# Patient Record
Sex: Male | Born: 1950 | Race: White | Hispanic: No | Smoking: Current every day smoker
Health system: Southern US, Community
[De-identification: ages and names within clinical notes are randomized; demographics above are authoritative.]

## PROBLEM LIST (undated history)

## (undated) DIAGNOSIS — E785 Hyperlipidemia, unspecified: Secondary | ICD-10-CM

## (undated) DIAGNOSIS — Z972 Presence of dental prosthetic device (complete) (partial): Secondary | ICD-10-CM

## (undated) DIAGNOSIS — K118 Other diseases of salivary glands: Secondary | ICD-10-CM

## (undated) HISTORY — PX: COLONOSCOPY: SHX174

## (undated) HISTORY — PX: HEMORROIDECTOMY: SUR656

## (undated) HISTORY — PX: ESOPHAGEAL DILATION: SHX303

## (undated) HISTORY — PX: SHOULDER SURGERY: SHX246

---

## 2001-08-05 ENCOUNTER — Emergency Department (HOSPITAL_COMMUNITY): Admission: EM | Admit: 2001-08-05 | Discharge: 2001-08-05 | Payer: Self-pay | Admitting: Emergency Medicine

## 2001-08-05 ENCOUNTER — Encounter: Payer: Self-pay | Admitting: Emergency Medicine

## 2005-01-25 ENCOUNTER — Emergency Department (HOSPITAL_COMMUNITY): Admission: EM | Admit: 2005-01-25 | Discharge: 2005-01-25 | Payer: Self-pay | Admitting: Emergency Medicine

## 2005-02-06 ENCOUNTER — Encounter: Admission: RE | Admit: 2005-02-06 | Discharge: 2005-02-06 | Payer: Self-pay | Admitting: Orthopedic Surgery

## 2005-04-02 ENCOUNTER — Ambulatory Visit (HOSPITAL_COMMUNITY): Admission: RE | Admit: 2005-04-02 | Discharge: 2005-04-02 | Payer: Self-pay | Admitting: Orthopedic Surgery

## 2006-02-17 ENCOUNTER — Ambulatory Visit (HOSPITAL_COMMUNITY): Admission: RE | Admit: 2006-02-17 | Discharge: 2006-02-17 | Payer: Self-pay | Admitting: Internal Medicine
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
## 2007-03-08 ENCOUNTER — Ambulatory Visit: Payer: Self-pay | Admitting: Internal Medicine

## 2007-03-08 ENCOUNTER — Ambulatory Visit (HOSPITAL_COMMUNITY): Admission: RE | Admit: 2007-03-08 | Discharge: 2007-03-08 | Payer: Self-pay | Admitting: Internal Medicine

## 2007-03-31 ENCOUNTER — Emergency Department (HOSPITAL_COMMUNITY): Admission: EM | Admit: 2007-03-31 | Discharge: 2007-03-31 | Payer: Self-pay | Admitting: Emergency Medicine

## 2010-11-26 NOTE — Op Note (Signed)
Dakota Oneill, Dakota Oneill                ACCOUNT NO.:  [id number]   MEDICAL RECORD NO.:  [id number]          PATIENT TYPE:  AMB   LOCATION:  DAY                           FACILITY:  [facility]   PHYSICIAN:  R. Roetta Sessions, M.D. DATE OF BIRTH:  31-Aug-1950   DATE OF PROCEDURE:  03/08/2007  DATE OF DISCHARGE:                               OPERATIVE REPORT   PROCEDURE:  Screening colonoscopy.   ENDOSCOPIST:  Gerrit Friends. Rourk, M.D.   INDICATIONS FOR PROCEDURE:  This 60 year old gentleman sent over at the  courtesy of Dr. Carylon Perches for colorectal cancer screening.  He tells he  that he had a colonoscopy 10 years ago at [facility]  for reasons that he cannot recall.  He has no lower GI tract symptoms  currently.  There is no family history in first degree relatives of  colon cancer.  Colonoscopy is now being done as a screening maneuver.  This approach has been discussed with the patient at length.  The  potential risks, benefits, and alternatives have been reviewed,  questions answered.  He is agreeable.  Please see the documentation in  the medical record.   PROCEDURE NOTE:  O2 saturation, blood pressure, pulse and respirations  were monitored throughout the entire procedure.   CONSCIOUS SEDATION:  Versed 6 mg IV, Demerol 100 mg IV in divided doses.   INSTRUMENT:  Pentax video chip system.   FINDINGS:  Digital rectal exam revealed no abnormalities.   ENDOSCOPIC FINDINGS:  The prep was adequate.   COLON:  The colonic mucosa was surveyed from the rectosigmoid junction  through the left transverse and right colon to the area of the  appendiceal orifice, ileocecal valve, and cecum.  These structures were  well seen and photographed for the record.  From this level the scope  was slowly withdrawn.  All previously mentioned mucosal surfaces were  again seen.  The patient had a few tiny, pancolonic diverticula.  The  remainder of the colonic mucosa appeared normal.   The scope  was pulled down into the rectum.  A thorough examination of  the rectal mucosa on retroflex view of the anal verge demonstrated no  abnormalities aside from some minimal internal hemorrhoids.  The patient  tolerated the procedure well and was reacted in endoscopy.   IMPRESSION:  1. Minimal internal hemorrhoids otherwise normal rectum.  2. A few scattered pancolonic diverticula.  3. The remainder of the colonic mucosa appeared normal.   RECOMMENDATIONS:  1. Diverticulosis literature provided to Mr. Staszewski.  2. Consider repeat screening colonoscopy in 10 years.      Jonathon Bellows, M.D.  Electronically Signed     RMR/MEDQ  D:  03/08/2007  T:  03/08/2007  Job:  [id number]   cc:   Kingsley Callander. Ouida Sills, MD  Fax: [phone]

## 2012-09-15 ENCOUNTER — Other Ambulatory Visit (INDEPENDENT_AMBULATORY_CARE_PROVIDER_SITE_OTHER): Payer: Self-pay | Admitting: Otolaryngology

## 2012-09-15 DIAGNOSIS — R221 Localized swelling, mass and lump, neck: Secondary | ICD-10-CM

## 2012-09-20 ENCOUNTER — Ambulatory Visit (HOSPITAL_COMMUNITY)
Admission: RE | Admit: 2012-09-20 | Discharge: 2012-09-20 | Disposition: A | Discharge status: Home / Self Care | Payer: 59 | Source: Ambulatory Visit | Attending: Otolaryngology | Admitting: Otolaryngology

## 2012-09-20 DIAGNOSIS — R9389 Abnormal findings on diagnostic imaging of other specified body structures: Secondary | ICD-10-CM | POA: Insufficient documentation

## 2012-09-20 DIAGNOSIS — R221 Localized swelling, mass and lump, neck: Secondary | ICD-10-CM

## 2012-09-20 DIAGNOSIS — K119 Disease of salivary gland, unspecified: Secondary | ICD-10-CM | POA: Insufficient documentation

## 2012-09-20 DIAGNOSIS — R220 Localized swelling, mass and lump, head: Secondary | ICD-10-CM | POA: Insufficient documentation

## 2012-09-20 MED ORDER — IOHEXOL 300 MG/ML  SOLN
75.0000 mL | Freq: Once | INTRAMUSCULAR | Status: AC | PRN
  Administered 2012-09-20: 75 mL via INTRAVENOUS

## 2012-10-12 ENCOUNTER — Encounter (HOSPITAL_BASED_OUTPATIENT_CLINIC_OR_DEPARTMENT_OTHER): Payer: Self-pay | Admitting: *Deleted

## 2012-10-12 DIAGNOSIS — K118 Other diseases of salivary glands: Secondary | ICD-10-CM

## 2012-10-12 HISTORY — DX: Other diseases of salivary glands: K11.8

## 2012-10-16 ENCOUNTER — Emergency Department (HOSPITAL_COMMUNITY)
Admission: EM | Admit: 2012-10-16 | Discharge: 2012-10-16 | Disposition: A | Discharge status: Home / Self Care | Payer: 59 | Attending: Emergency Medicine | Admitting: Emergency Medicine

## 2012-10-16 ENCOUNTER — Encounter (HOSPITAL_COMMUNITY): Payer: Self-pay | Admitting: *Deleted

## 2012-10-16 ENCOUNTER — Emergency Department (HOSPITAL_COMMUNITY): Payer: 59

## 2012-10-16 DIAGNOSIS — R21 Rash and other nonspecific skin eruption: Secondary | ICD-10-CM | POA: Insufficient documentation

## 2012-10-16 DIAGNOSIS — W57XXXA Bitten or stung by nonvenomous insect and other nonvenomous arthropods, initial encounter: Secondary | ICD-10-CM

## 2012-10-16 DIAGNOSIS — F17200 Nicotine dependence, unspecified, uncomplicated: Secondary | ICD-10-CM | POA: Insufficient documentation

## 2012-10-16 DIAGNOSIS — R221 Localized swelling, mass and lump, neck: Secondary | ICD-10-CM | POA: Insufficient documentation

## 2012-10-16 DIAGNOSIS — Z98811 Dental restoration status: Secondary | ICD-10-CM | POA: Insufficient documentation

## 2012-10-16 DIAGNOSIS — Z79899 Other long term (current) drug therapy: Secondary | ICD-10-CM | POA: Insufficient documentation

## 2012-10-16 DIAGNOSIS — R1310 Dysphagia, unspecified: Secondary | ICD-10-CM

## 2012-10-16 DIAGNOSIS — L089 Local infection of the skin and subcutaneous tissue, unspecified: Secondary | ICD-10-CM | POA: Insufficient documentation

## 2012-10-16 DIAGNOSIS — Y929 Unspecified place or not applicable: Secondary | ICD-10-CM | POA: Insufficient documentation

## 2012-10-16 DIAGNOSIS — Y9389 Activity, other specified: Secondary | ICD-10-CM | POA: Insufficient documentation

## 2012-10-16 DIAGNOSIS — R220 Localized swelling, mass and lump, head: Secondary | ICD-10-CM | POA: Insufficient documentation

## 2012-10-16 MED ORDER — CEPHALEXIN 500 MG PO CAPS: 500.0000 mg | ORAL_CAPSULE | Freq: Three times a day (TID) | ORAL | Status: DC

## 2012-10-16 MED ORDER — DIPHENHYDRAMINE HCL 50 MG/ML IJ SOLN
25.0000 mg | Freq: Once | INTRAMUSCULAR | Status: AC
  Administered 2012-10-16: 25 mg via INTRAVENOUS
  Filled 2012-10-16: qty 1

## 2012-10-16 MED ORDER — FAMOTIDINE IN NACL 20-0.9 MG/50ML-% IV SOLN
20.0000 mg | Freq: Once | INTRAVENOUS | Status: AC
  Administered 2012-10-16: 20 mg via INTRAVENOUS
  Filled 2012-10-16: qty 50

## 2012-10-16 NOTE — ED Provider Notes (Signed)
This chart was scribed for Carleene Cooper III, MD by Charolett Bumpers, ED Scribe. The patient was seen in room APA18/APA18. Patient's care was started at 08:46. Medical screening examination/treatment/procedure(s) were conducted as a shared visit with non-physician practitioner(s) and myself.  I personally evaluated the patient during the encounter   Dakota Oneill is a 62 y.o. male who presents to the Emergency Department complaining of difficulty swallowing that began this morning. 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 He states that he drank cold water with some relief, but the tightness has returned. He states that he was bit by an insect 2 days ago that remains red and swollen. He states that he applied alcohol and neosporin on the bite afterwards. He states that he has been eating okay, but hasn't eaten anything since last night. He denies any trouble with handling his secretions.   Dr. 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   This is a shared visit with Janne Napoleon, NP  I personally performed the services described in this documentation, which was scribed in my presence. The recorded information has been reviewed and is accurate.  Osvaldo Human, M.D.     Carleene Cooper III, MD 10/16/12 [phone]

## 2012-10-16 NOTE — ED Notes (Signed)
Patient with no complaints at this time. Respirations even and unlabored. Skin warm/dry. Discharge instructions reviewed with patient at this time. Patient given opportunity to voice concerns/ask questions. IV removed per policy and band-aid applied to site. Patient discharged at this time and left Emergency Department with steady gait.  
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
## 2012-10-16 NOTE — ED Provider Notes (Signed)
This chart was scribed for Dakota Cooper III, MD by Charolett Bumpers, ED Scribe. The patient was seen in room APA18/APA18. Patient's care was started at 08:46.  Medical screening examination/treatment/procedure(s) were conducted as a shared visit with non-physician practitioner(s) and myself. I personally evaluated the patient during the encounter  Dakota Oneill is a 63 y.o. male who presents to the Emergency Department complaining of difficulty swallowing that began this morning. 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 He states that he drank cold water with some relief, but the tightness has returned. He states that he was bit by an insect 2 days ago that remains red and swollen. He states that he applied alcohol and neosporin on the bite afterwards. He states that he has been eating okay, but hasn't eaten anything since last night. He denies any trouble with handling his secretions.  Dr. 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   This is a shared visit with Janne Napoleon, NP  I personally performed the services described in this documentation, which was scribed in my presence. The recorded information has been reviewed and is accurate.  Osvaldo Human, M.D.  Dakota Cooper III, MD  10/16/12 1806   Dakota Cooper III, MD 10/16/12 [phone]

## 2012-10-16 NOTE — ED Notes (Signed)
Patient to have surgery on R neck tumors Monday, began having difficulty swallowing this morning.  Also has unknown insect bite on R neck from Thursday, reddened and swollen.

## 2012-10-16 NOTE — ED Notes (Signed)
States he feels like he can swallow a little easier now.

## 2012-10-18 ENCOUNTER — Encounter (HOSPITAL_BASED_OUTPATIENT_CLINIC_OR_DEPARTMENT_OTHER): Payer: Self-pay | Admitting: Anesthesiology

## 2012-10-18 ENCOUNTER — Ambulatory Visit (HOSPITAL_BASED_OUTPATIENT_CLINIC_OR_DEPARTMENT_OTHER)
Admission: RE | Admit: 2012-10-18 | Discharge: 2012-10-19 | Disposition: A | Discharge status: Home / Self Care | Payer: 59 | Source: Ambulatory Visit | Attending: Otolaryngology | Admitting: Otolaryngology

## 2012-10-18 ENCOUNTER — Encounter (HOSPITAL_BASED_OUTPATIENT_CLINIC_OR_DEPARTMENT_OTHER): Payer: Self-pay

## 2012-10-18 ENCOUNTER — Encounter (HOSPITAL_BASED_OUTPATIENT_CLINIC_OR_DEPARTMENT_OTHER)
Admission: RE | Disposition: A | Discharge status: Home / Self Care | Payer: Self-pay | Source: Ambulatory Visit | Attending: Otolaryngology

## 2012-10-18 ENCOUNTER — Ambulatory Visit (HOSPITAL_BASED_OUTPATIENT_CLINIC_OR_DEPARTMENT_OTHER): Payer: 59 | Admitting: Anesthesiology

## 2012-10-18 DIAGNOSIS — D119 Benign neoplasm of major salivary gland, unspecified: Secondary | ICD-10-CM | POA: Insufficient documentation

## 2012-10-18 DIAGNOSIS — Z9049 Acquired absence of other specified parts of digestive tract: Secondary | ICD-10-CM

## 2012-10-18 DIAGNOSIS — F17200 Nicotine dependence, unspecified, uncomplicated: Secondary | ICD-10-CM | POA: Insufficient documentation

## 2012-10-18 HISTORY — PX: PAROTIDECTOMY: SHX2163

## 2012-10-18 HISTORY — DX: Presence of dental prosthetic device (complete) (partial): Z97.2

## 2012-10-18 HISTORY — DX: Other diseases of salivary glands: K11.8

## 2012-10-18 LAB — POCT HEMOGLOBIN-HEMACUE: Hemoglobin: 19.3 g/dL — ABNORMAL HIGH (ref 13.0–17.0)

## 2012-10-18 SURGERY — EXCISION, PAROTID GLAND
Anesthesia: General | Site: Neck | Laterality: Right | Wound class: Clean

## 2012-10-18 MED ORDER — ALPRAZOLAM 1 MG PO TABS
1.0000 mg | ORAL_TABLET | Freq: Every evening | ORAL | Status: DC | PRN
  Administered 2012-10-19: 1 mg via ORAL

## 2012-10-18 MED ORDER — PROPOFOL 10 MG/ML IV BOLUS
INTRAVENOUS | Status: DC | PRN
  Administered 2012-10-18: 30 mg via INTRAVENOUS
  Administered 2012-10-18: 150 mg via INTRAVENOUS

## 2012-10-18 MED ORDER — MIDAZOLAM HCL 2 MG/2ML IJ SOLN: 1.0000 mg | INTRAMUSCULAR | Status: DC | PRN

## 2012-10-18 MED ORDER — FENTANYL CITRATE 0.05 MG/ML IJ SOLN: 50.0000 ug | INTRAMUSCULAR | Status: DC | PRN

## 2012-10-18 MED ORDER — LACTATED RINGERS IV SOLN
INTRAVENOUS | Status: DC
  Administered 2012-10-18 (×3): via INTRAVENOUS

## 2012-10-18 MED ORDER — ONDANSETRON HCL 4 MG/2ML IJ SOLN
INTRAMUSCULAR | Status: DC | PRN
  Administered 2012-10-18: 4 mg via INTRAVENOUS

## 2012-10-18 MED ORDER — MIDAZOLAM HCL 2 MG/ML PO SYRP: 12.0000 mg | ORAL_SOLUTION | Freq: Once | ORAL | Status: DC | PRN

## 2012-10-18 MED ORDER — MORPHINE SULFATE 2 MG/ML IJ SOLN: 2.0000 mg | INTRAMUSCULAR | Status: DC | PRN

## 2012-10-18 MED ORDER — PROMETHAZINE HCL 25 MG RE SUPP: 25.0000 mg | Freq: Four times a day (QID) | RECTAL | Status: DC | PRN

## 2012-10-18 MED ORDER — CEPHALEXIN 500 MG PO CAPS
500.0000 mg | ORAL_CAPSULE | Freq: Three times a day (TID) | ORAL | Status: DC
  Administered 2012-10-18 – 2012-10-19 (×3): 500 mg via ORAL

## 2012-10-18 MED ORDER — DEXAMETHASONE SODIUM PHOSPHATE 4 MG/ML IJ SOLN
INTRAMUSCULAR | Status: DC | PRN
  Administered 2012-10-18: 10 mg via INTRAVENOUS

## 2012-10-18 MED ORDER — CEFAZOLIN SODIUM-DEXTROSE 2-3 GM-% IV SOLR
INTRAVENOUS | Status: DC | PRN
  Administered 2012-10-18: 2 g via INTRAVENOUS

## 2012-10-18 MED ORDER — KCL IN DEXTROSE-NACL 20-5-0.45 MEQ/L-%-% IV SOLN
INTRAVENOUS | Status: DC
  Administered 2012-10-18: 13:00:00 via INTRAVENOUS

## 2012-10-18 MED ORDER — PROMETHAZINE HCL 25 MG PO TABS: 25.0000 mg | ORAL_TABLET | Freq: Four times a day (QID) | ORAL | Status: DC | PRN

## 2012-10-18 MED ORDER — FENTANYL CITRATE 0.05 MG/ML IJ SOLN
INTRAMUSCULAR | Status: DC | PRN
  Administered 2012-10-18 (×2): 25 ug via INTRAVENOUS
  Administered 2012-10-18: 100 ug via INTRAVENOUS
  Administered 2012-10-18: 50 ug via INTRAVENOUS

## 2012-10-18 MED ORDER — OXYCODONE HCL 5 MG/5ML PO SOLN: 5.0000 mg | Freq: Once | ORAL | Status: AC | PRN

## 2012-10-18 MED ORDER — OXYCODONE-ACETAMINOPHEN 5-325 MG PO TABS
1.0000 | ORAL_TABLET | ORAL | Status: DC | PRN
  Administered 2012-10-18 – 2012-10-19 (×5): 2 via ORAL

## 2012-10-18 MED ORDER — HYDROMORPHONE HCL PF 1 MG/ML IJ SOLN: 0.2500 mg | INTRAMUSCULAR | Status: DC | PRN

## 2012-10-18 MED ORDER — LIDOCAINE-EPINEPHRINE 1 %-1:100000 IJ SOLN
INTRAMUSCULAR | Status: DC | PRN
  Administered 2012-10-18: 5 mL

## 2012-10-18 MED ORDER — ZOLPIDEM TARTRATE 5 MG PO TABS: 5.0000 mg | ORAL_TABLET | Freq: Every evening | ORAL | Status: DC | PRN

## 2012-10-18 MED ORDER — GLYCOPYRROLATE 0.2 MG/ML IJ SOLN
INTRAMUSCULAR | Status: DC | PRN
  Administered 2012-10-18: 0.2 mg via INTRAVENOUS

## 2012-10-18 MED ORDER — OXYCODONE HCL 5 MG PO TABS
5.0000 mg | ORAL_TABLET | Freq: Once | ORAL | Status: AC | PRN
Start: 2012-10-18 — End: 2012-10-18

## 2012-10-18 MED ORDER — LIDOCAINE HCL (CARDIAC) 10 MG/ML IV SOLN
INTRAVENOUS | Status: DC | PRN
  Administered 2012-10-18: 80 mg via INTRAVENOUS

## 2012-10-18 MED ORDER — EPHEDRINE SULFATE 50 MG/ML IJ SOLN
INTRAMUSCULAR | Status: DC | PRN
  Administered 2012-10-18: 10 mg via INTRAVENOUS
  Administered 2012-10-18: 5 mg via INTRAVENOUS

## 2012-10-18 MED ORDER — MIDAZOLAM HCL 5 MG/5ML IJ SOLN
INTRAMUSCULAR | Status: DC | PRN
  Administered 2012-10-18: 2 mg via INTRAVENOUS

## 2012-10-18 MED ORDER — SUCCINYLCHOLINE CHLORIDE 20 MG/ML IJ SOLN
INTRAMUSCULAR | Status: DC | PRN
  Administered 2012-10-18: 100 mg via INTRAVENOUS

## 2012-10-18 SURGICAL SUPPLY — 69 items
ADH SKN CLS APL DERMABOND .7 (GAUZE/BANDAGES/DRESSINGS) ×1
APL SRG 3 HI ABS STRL LF PLS (MISCELLANEOUS) ×1
APPLICATOR DR MATTHEWS STRL (MISCELLANEOUS) ×2 IMPLANT
ATTRACTOMAT 16X20 MAGNETIC DRP (DRAPES) ×2 IMPLANT
BAG DECANTER FOR FLEXI CONT (MISCELLANEOUS) IMPLANT
BALL CTTN LRG ABS STRL LF (GAUZE/BANDAGES/DRESSINGS) ×1
BLADE SURG 15 STRL LF DISP TIS (BLADE) ×1 IMPLANT
BLADE SURG 15 STRL SS (BLADE) ×2
CANISTER SUCTION 1200CC (MISCELLANEOUS) ×2 IMPLANT
CLOTH BEACON ORANGE TIMEOUT ST (SAFETY) ×2 IMPLANT
CORDS BIPOLAR (ELECTRODE) ×2 IMPLANT
COTTONBALL LRG STERILE PKG (GAUZE/BANDAGES/DRESSINGS) ×1 IMPLANT
COVER MAYO STAND STRL (DRAPES) ×2 IMPLANT
COVER TABLE BACK 60X90 (DRAPES) ×2 IMPLANT
DECANTER SPIKE VIAL GLASS SM (MISCELLANEOUS) ×1 IMPLANT
DERMABOND ADVANCED (GAUZE/BANDAGES/DRESSINGS) ×1
DERMABOND ADVANCED .7 DNX12 (GAUZE/BANDAGES/DRESSINGS) IMPLANT
DRAIN CHANNEL 10F 3/8 F FF (DRAIN) ×1 IMPLANT
DRAIN CHANNEL 7F FF FLAT (WOUND CARE) IMPLANT
DRAIN TLS ROUND 10FR (DRAIN) IMPLANT
DRAPE INCISE 23X17 IOBAN STRL (DRAPES) ×1
DRAPE INCISE 23X17 STRL (DRAPES) ×1 IMPLANT
DRAPE INCISE IOBAN 23X17 STRL (DRAPES) ×1 IMPLANT
DRAPE U-SHAPE 76X120 STRL (DRAPES) ×2 IMPLANT
ELECT COATED BLADE 2.86 ST (ELECTRODE) ×2 IMPLANT
ELECT PAIRED SUBDERMAL (MISCELLANEOUS) ×2
ELECT REM PT RETURN 9FT ADLT (ELECTROSURGICAL) ×2
ELECTRODE PAIRED SUBDERMAL (MISCELLANEOUS) ×1 IMPLANT
ELECTRODE REM PT RTRN 9FT ADLT (ELECTROSURGICAL) ×1 IMPLANT
EVACUATOR SILICONE 100CC (DRAIN) ×1 IMPLANT
GAUZE SPONGE 4X4 12PLY STRL LF (GAUZE/BANDAGES/DRESSINGS) ×1 IMPLANT
GAUZE SPONGE 4X4 16PLY XRAY LF (GAUZE/BANDAGES/DRESSINGS) ×3 IMPLANT
GLOVE BIO SURGEON STRL SZ 6.5 (GLOVE) ×1 IMPLANT
GLOVE BIO SURGEON STRL SZ7.5 (GLOVE) ×2 IMPLANT
GLOVE ECLIPSE 7.5 STRL STRAW (GLOVE) IMPLANT
GLOVE INDICATOR 7.0 STRL GRN (GLOVE) ×1 IMPLANT
GLOVE SKINSENSE NS SZ7.0 (GLOVE) ×1
GLOVE SKINSENSE STRL SZ7.0 (GLOVE) IMPLANT
GOWN PREVENTION PLUS XLARGE (GOWN DISPOSABLE) ×3 IMPLANT
LOCATOR NERVE 3 VOLT (DISPOSABLE) IMPLANT
NDL HYPO 25X1 1.5 SAFETY (NEEDLE) ×1 IMPLANT
NEEDLE HYPO 25X1 1.5 SAFETY (NEEDLE) ×2 IMPLANT
NS IRRIG 1000ML POUR BTL (IV SOLUTION) ×2 IMPLANT
PACK BASIN DAY SURGERY FS (CUSTOM PROCEDURE TRAY) ×2 IMPLANT
PAD ALCOHOL SWAB (MISCELLANEOUS) ×3 IMPLANT
PENCIL BUTTON HOLSTER BLD 10FT (ELECTRODE) ×2 IMPLANT
PIN SAFETY STERILE (MISCELLANEOUS) IMPLANT
PROBE NERVBE PRASS .33 (MISCELLANEOUS) ×1 IMPLANT
SLEEVE SCD COMPRESS KNEE MED (MISCELLANEOUS) ×1 IMPLANT
SPONGE INTESTINAL PEANUT (DISPOSABLE) ×3 IMPLANT
STAPLER VISISTAT 35W (STAPLE) IMPLANT
SUT CHROMIC 4 0 PS 2 18 (SUTURE) IMPLANT
SUT ETHILON 3 0 PS 1 (SUTURE) ×1 IMPLANT
SUT PROLENE 5 0 P 3 (SUTURE) ×2 IMPLANT
SUT SILK 2 0 FS (SUTURE) ×5 IMPLANT
SUT SILK 2 0 TIES 17X18 (SUTURE)
SUT SILK 2-0 18XBRD TIE BLK (SUTURE) IMPLANT
SUT SILK 3 0 PS 1 (SUTURE) IMPLANT
SUT SILK 3 0 TIES 17X18 (SUTURE) ×4
SUT SILK 3-0 18XBRD TIE BLK (SUTURE) ×2 IMPLANT
SUT SILK 4 0 TIES 17X18 (SUTURE) ×2 IMPLANT
SUT VIC AB 3-0 FS2 27 (SUTURE) ×2 IMPLANT
SUT VICRYL 4-0 PS2 18IN ABS (SUTURE) ×1 IMPLANT
SYR BULB 3OZ (MISCELLANEOUS) ×2 IMPLANT
SYR CONTROL 10ML LL (SYRINGE) ×2 IMPLANT
TOWEL OR 17X24 6PK STRL BLUE (TOWEL DISPOSABLE) ×2 IMPLANT
TRAY DSU PREP LF (CUSTOM PROCEDURE TRAY) ×2 IMPLANT
TRAY FOLEY CATH 14FR (SET/KITS/TRAYS/PACK) IMPLANT
TUBE CONNECTING 20X1/4 (TUBING) ×2 IMPLANT

## 2012-10-18 NOTE — Brief Op Note (Signed)
10/18/2012  11:21 AM  PATIENT:  Dakota Oneill  62 y.o. male  PRE-OPERATIVE DIAGNOSIS:  RIGHT PAROTID MASS  POST-OPERATIVE DIAGNOSIS:  RIGHT PAROTID MASS  PROCEDURE:  Procedure(s): RIGHT LATERAL PAROTIDECTOMY (Right)  SURGEON:  Surgeon(s) and Role:    * Darletta Moll, MD - Primary  PHYSICIAN ASSISTANT:   ASSISTANTS: Roma Schanz, PA-C  ANESTHESIA:   general  EBL:  Total I/O In: 2000 [I.V.:2000] Out: -   BLOOD ADMINISTERED:none  DRAINS: (#10) Jackson-Pratt drain(s) with closed bulb suction in the neck   LOCAL MEDICATIONS USED:  LIDOCAINE   SPECIMEN:  Source of Specimen:  Right lateral parotid lobe  DISPOSITION OF SPECIMEN:  PATHOLOGY  COUNTS:  YES  TOURNIQUET:  * No tourniquets in log *  DICTATION: .Other Dictation: Dictation Number [id number]  PLAN OF CARE: Admit for overnight observation  PATIENT DISPOSITION:  PACU - hemodynamically stable.   Delay start of Pharmacological VTE agent (>24hrs) due to surgical blood loss or risk of bleeding: not applicable

## 2012-10-18 NOTE — Progress Notes (Signed)
Dr Suszanne Conners notified of critical hemocue result

## 2012-10-18 NOTE — Anesthesia Preprocedure Evaluation (Addendum)
Anesthesia Evaluation  Patient identified by MRN, date of birth, ID band Patient awake    Reviewed: Allergy & Precautions, H&P , NPO status , Patient's Chart, lab work & pertinent test results  Airway Mallampati: III TM Distance: >3 FB Neck ROM: Full    Dental no notable dental hx. (+) Teeth Intact and Dental Advisory Given   Pulmonary Current Smoker,  breath sounds clear to auscultation  Pulmonary exam normal       Cardiovascular negative cardio ROS  Rhythm:Regular Rate:Normal     Neuro/Psych negative neurological ROS  negative psych ROS   GI/Hepatic negative GI ROS, Neg liver ROS,   Endo/Other  negative endocrine ROS  Renal/GU negative Renal ROS  negative genitourinary   Musculoskeletal   Abdominal   Peds  Hematology negative hematology ROS (+)   Anesthesia Other Findings   Reproductive/Obstetrics negative OB ROS                          Anesthesia Physical Anesthesia Plan  ASA: II  Anesthesia Plan: General   Post-op Pain Management:    Induction: Intravenous  Airway Management Planned: Oral ETT  Additional Equipment:   Intra-op Plan:   Post-operative Plan: Extubation in OR  Informed Consent: I have reviewed the patients History and Physical, chart, labs and discussed the procedure including the risks, benefits and alternatives for the proposed anesthesia with the patient or authorized representative who has indicated his/her understanding and acceptance.   Dental advisory given  Plan Discussed with: CRNA  Anesthesia Plan Comments:         Anesthesia Quick Evaluation

## 2012-10-18 NOTE — Anesthesia Postprocedure Evaluation (Signed)
  Anesthesia Post-op Note  Patient: Dakota Oneill  Procedure(s) Performed: Procedure(s): RIGHT LATERAL PAROTIDECTOMY (Right)  Patient Location: PACU  Anesthesia Type:General  Level of Consciousness: awake and alert   Airway and Oxygen Therapy: Patient Spontanous Breathing  Post-op Pain: mild  Post-op Assessment: Post-op Vital signs reviewed, Patient's Cardiovascular Status Stable, Respiratory Function Stable, Patent Airway and No signs of Nausea or vomiting  Post-op Vital Signs: Reviewed and stable  Complications: No apparent anesthesia complications

## 2012-10-18 NOTE — Anesthesia Procedure Notes (Signed)
Procedure Name: Intubation Date/Time: 10/18/2012 9:14 AM Performed by: Gar Gibbon Pre-anesthesia Checklist: Patient identified, Emergency Drugs available, Suction available and Patient being monitored Patient Re-evaluated:Patient Re-evaluated prior to inductionOxygen Delivery Method: Circle System Utilized Preoxygenation: Pre-oxygenation with 100% oxygen Intubation Type: IV induction Ventilation: Mask ventilation without difficulty Laryngoscope Size: Miller and 3 Grade View: Grade III Tube type: Oral Rae Tube size: 8.0 mm Number of attempts: 1 Airway Equipment and Method: stylet and oral airway Placement Confirmation: ETT inserted through vocal cords under direct vision,  positive ETCO2 and breath sounds checked- equal and bilateral Secured at: 22 cm Tube secured with: Tape Dental Injury: Teeth and Oropharynx as per pre-operative assessment

## 2012-10-18 NOTE — Transfer of Care (Signed)
Immediate Anesthesia Transfer of Care Note  Patient: Dakota Oneill  Procedure(s) Performed: Procedure(s): RIGHT LATERAL PAROTIDECTOMY (Right)  Patient Location: PACU  Anesthesia Type:General  Level of Consciousness: awake, alert  and oriented  Airway & Oxygen Therapy: Patient Spontanous Breathing and Patient connected to face mask oxygen  Post-op Assessment: Report given to PACU RN and Post -op Vital signs reviewed and stable  Post vital signs: Reviewed and stable  Complications: No apparent anesthesia complications

## 2012-10-18 NOTE — H&P (Signed)
  H&P Update  Pt's original H&P dated 09/27/12 reviewed and placed in chart (to be scanned).  I personally examined the patient today.  No change in health. Proceed with right parotidectomy.

## 2012-10-19 ENCOUNTER — Encounter (HOSPITAL_BASED_OUTPATIENT_CLINIC_OR_DEPARTMENT_OTHER): Payer: Self-pay | Admitting: Otolaryngology

## 2012-10-19 MED ORDER — OXYCODONE-ACETAMINOPHEN 5-325 MG PO TABS: 1.0000 | ORAL_TABLET | ORAL | Status: DC | PRN

## 2012-10-19 NOTE — Discharge Summary (Signed)
Physician Discharge Summary  Patient ID: Dakota Oneill MRN: [id number] DOB/AGE: April 15, 1951 62 y.o.  Admit date: 10/18/2012 Discharge date: 10/19/2012  Admission Diagnoses: Right parotid mass  Discharge Diagnoses: Right parotid mass Active Problems:   * No active hospital problems. *   Discharged Condition: good  Hospital Course: Pt had an uneventful overnight stay. Pt tolerated po well. No bleeding. No facial nerve weakness.  Consults: None  Significant Diagnostic Studies: none  Treatments: surgery: Right parotidectomy  Discharge Exam: Blood pressure 119/76, pulse 75, temperature 97.6 F (36.4 C), temperature source Oral, resp. rate 16, height 6\' 1"  (1.854 m), weight 96.253 kg (212 lb 3.2 oz), SpO2 97.00%. Incision: c/d/i  Disposition: 01-Home or Self Care  Discharge Orders   Future Orders Complete By Expires     Activity as tolerated - No restrictions  As directed     Diet general  As directed         Medication List    TAKE these medications       ALPRAZolam 1 MG tablet  Commonly known as:  XANAX  Take 1 mg by mouth at bedtime as needed for sleep.     cephALEXin 500 MG capsule  Commonly known as:  KEFLEX  Take 1 capsule (500 mg total) by mouth 3 (three) times daily.     fexofenadine-pseudoephedrine 180-240 MG per 24 hr tablet  Commonly known as:  ALLEGRA-D 24  Take 1 tablet by mouth daily.     fish oil-omega-3 fatty acids 1000 MG capsule  Take 2 g by mouth daily.     multivitamin with minerals Tabs  Take 1 tablet by mouth daily.     oxyCODONE-acetaminophen 5-325 MG per tablet  Commonly known as:  ROXICET  Take 1 tablet by mouth every 4 (four) hours as needed for pain.     vitamin B-12 100 MCG tablet  Commonly known as:  CYANOCOBALAMIN  Take 50 mcg by mouth daily.     vitamin C 100 MG tablet  Take 100 mg by mouth daily.     vitamin E 100 UNIT capsule  Take 100 Units by mouth daily.         Signed: Darletta Moll 10/19/2012, 7:23 AM

## 2012-10-20 NOTE — Op Note (Signed)
NAMETIMMY, BUBECK                ACCOUNT NO.:  [id number]  MEDICAL RECORD NO.:  [id number]  LOCATION:                               FACILITY:  [facility]  PHYSICIAN:  Newman Pies, MD            DATE OF BIRTH:  [birth year]  DATE OF PROCEDURE:  10/18/2012 DATE OF DISCHARGE:  10/15/2012                              OPERATIVE REPORT   PREOPERATIVE DIAGNOSIS:  Right parotid mass.  POSTOPERATIVE DIAGNOSIS:  Right parotid mass.  PROCEDURE PERFORMED:  Right lateral parotidectomy with facial nerve dissection.  ANESTHESIA:  General endotracheal tube anesthesia.  COMPLICATIONS:  None.  ESTIMATED BLOOD LOSS:  Minimal.  INDICATION FOR PROCEDURE:  The patient is a 62 year old male with upper right neck mass.  On CT scan, the mass was noted to be within the tail of the right parotid.  A smaller lesion was also noted superior to the originally noted neck mass.  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  Based on the above findings, the decision was made for the patient to undergo the above stated procedure. The risks, benefits, alternatives, and details of the procedure were discussed with the patient.  Questions were invited and answered. Informed consent was obtained.  DESCRIPTION OF PROCEDURE:  The patient was taken to the operating room and placed supine on the operating table.  General endotracheal tube anesthesia was administered by the anesthesiologist.  Preop IV antibiotics was given.  The patient was positioned and prepped and draped in a standard fashion for right lateral parotidectomy surgery. Facial nerve electrodes were placed.  The facial nerve monitoring system was noted to be functional throughout the case.  A 1% lidocaine with 100,000 epinephrine was injected at the planned site of incisions.  A right preauricular incision was made, which was carried down inferiorly to the right lateral neck.  The incision was carried down to the level of the SMAS  layer.  The SMAS flap was then elevated in a standard fashion.  The great auricular nerve was identified and sacrificed.  At this time, the previously noted 2 cm posterior parotid mass was noted. A smaller mass was also noted superior to the 2 cm mass.  Using a combination of sharp dissection and Bovie electrocautery, the soft tissue was resected free from the anterior auricular cartilage.  The dissection was carried down to the level of the facial nerve.  The main trunk of the facial nerve was identified and dissected free from the parotid tissue.  The tumors were noted to be inferior to the inferior branch of the facial nerve.  Dissection was therefore carried out along the inferior branches of the facial nerve, thus freeing the tumors from the facial nerve.  All 5 branches of the facial nerves were identified and preserved.  The entire tumor, together with lateral lobe of the parotid gland was dissected free.  The specimen was sent to the Pathology Department for permanent histologic identification.  The surgical site was copiously irrigated.  A #10 JP drain was placed.  The incision was closed in layers with 3-0 Vicryl and 5-0 Prolene sutures. The care of the  patient was turned over to the anesthesiologist.  The patient was awakened from anesthesia without difficulty.  He was extubated and transferred to the recovery room in good condition.  OPERATIVE FINDINGS:  A 2 cm parotid mass was noted within the tail of the right parotid.  A smaller mass was noted superior to the parotid mass.  The facial nerve was identified and preserved.  SPECIMEN:  Right lateral parotid lobe.  FOLLOWUP CARE:  The patient will be observed overnight at the surgical center.  The JP drain will be removed on postop day #1.  He will most likely be discharged home on postop day #1.     Newman Pies, MD     ST/MEDQ  D:  10/18/2012  T:  10/19/2012  Job:  [id number]  cc:   Kingsley Callander. Ouida Sills, MD

## 2017-01-12 ENCOUNTER — Encounter: Payer: Self-pay | Admitting: Internal Medicine

## 2017-01-12 ENCOUNTER — Telehealth: Payer: Self-pay | Admitting: Internal Medicine

## 2017-01-12 NOTE — Telephone Encounter (Signed)
Letter mailed to pt.  

## 2017-01-12 NOTE — Telephone Encounter (Signed)
Recall for tcs °

## 2017-01-26 ENCOUNTER — Telehealth: Payer: Self-pay

## 2017-01-26 NOTE — Telephone Encounter (Signed)
[phone]   OR CELL [phone]  PATIENT RECEIVED LETTER TO SCHEDULE TCS

## 2017-02-02 ENCOUNTER — Telehealth: Payer: Self-pay

## 2017-02-02 NOTE — Telephone Encounter (Signed)
PT was triaged and will call back with his insurance information.

## 2017-02-02 NOTE — Telephone Encounter (Signed)
See separate triage.  

## 2017-02-12 NOTE — Telephone Encounter (Signed)
Will need OV due to meds and ETOH

## 2017-02-12 NOTE — Telephone Encounter (Signed)
LMOM to call back to schedule OV appt.

## 2017-02-12 NOTE — Telephone Encounter (Signed)
Gastroenterology Pre-Procedure Review  Request Date: 02/02/2017 Requesting Physician: ON RECALL/  DR. FAGAN IS PCP  PATIENT REVIEW QUESTIONS: The patient responded to the following health history questions as indicated:    LAST COLONOSCOPY 03/08/2007 BY DR. Gala Romney  1. Diabetes Melitis: no 2. Joint replacements in the past 12 months: no 3. Major health problems in the past 3 months: no 4. Has an artificial valve or MVP: no 5. Has a defibrillator: no 6. Has been advised in past to take antibiotics in advance of a procedure like teeth cleaning: no 7. Family history of colon cancer: no  8. Alcohol Use: 2-3 DAYS A WEEK WHEN NOT WORKING 9. History of sleep apnea: no  10. History of coronary artery or other vascular stents placed within the last 12 months: no 11. History of any prior anesthesia complications: no    MEDICATIONS & ALLERGIES:    Patient reports the following regarding taking any blood thinners:   Plavix? no Aspirin? no Coumadin? no Brilinta? no Xarelto? no Eliquis? no Pradaxa? no Savaysa? no Effient? no  Patient confirms/reports the following medications:  Current Outpatient Prescriptions  Medication Sig Dispense Refill  . ALPRAZolam (XANAX) 1 MG tablet Take 1 mg by mouth at bedtime as needed for sleep.    . Ascorbic Acid (VITAMIN C) 100 MG tablet Take 100 mg by mouth daily.    . fexofenadine (ALLEGRA) 180 MG tablet Take 180 mg by mouth daily.    . fish oil-omega-3 fatty acids 1000 MG capsule Take 2 g by mouth daily.    . Multiple Vitamin (MULTIVITAMIN WITH MINERALS) TABS Take 1 tablet by mouth daily.    . vitamin B-12 (CYANOCOBALAMIN) 100 MCG tablet Take 50 mcg by mouth daily.    . vitamin E 100 UNIT capsule Take 100 Units by mouth daily.    . fexofenadine-pseudoephedrine (ALLEGRA-D 24) 180-240 MG per 24 hr tablet Take 1 tablet by mouth daily.     No current facility-administered medications for this visit.     Patient confirms/reports the following allergies:   No Known Allergies  No orders of the defined types were placed in this encounter.   AUTHORIZATION INFORMATION Primary Insurance:  ID #:   Group #:  Pre-Cert / Auth required:  Pre-Cert / Auth #:   Secondary Insurance:   ID #:  Group #:  Pre-Cert / Auth required: Pre-Cert / Auth #:   SCHEDULE INFORMATION: Procedure has been scheduled as follows:  Date:                 Time:   Location:   This Gastroenterology Pre-Precedure Review Form is being routed to the following provider(s): R. Garfield Cornea, MD

## 2017-02-16 NOTE — Telephone Encounter (Signed)
Pt has been scheduled an OV with Neil Crouch, PA on 04/06/2017 at 8:00 am.

## 2017-04-06 ENCOUNTER — Other Ambulatory Visit: Payer: Self-pay

## 2017-04-06 ENCOUNTER — Encounter: Payer: Self-pay | Admitting: Gastroenterology

## 2017-04-06 ENCOUNTER — Ambulatory Visit (INDEPENDENT_AMBULATORY_CARE_PROVIDER_SITE_OTHER): Payer: 59 | Admitting: Gastroenterology

## 2017-04-06 DIAGNOSIS — T8852XA Failed moderate sedation during procedure, initial encounter: Secondary | ICD-10-CM | POA: Insufficient documentation

## 2017-04-06 DIAGNOSIS — Z1211 Encounter for screening for malignant neoplasm of colon: Secondary | ICD-10-CM | POA: Insufficient documentation

## 2017-04-06 DIAGNOSIS — T8852XD Failed moderate sedation during procedure, subsequent encounter: Secondary | ICD-10-CM | POA: Diagnosis not present

## 2017-04-06 MED ORDER — PEG 3350-KCL-NA BICARB-NACL 420 G PO SOLR: 4000.0000 mL | ORAL | 0 refills | Status: DC

## 2017-04-06 NOTE — Patient Instructions (Signed)
1. Colonoscopy as scheduled. See separate instructions.  

## 2017-04-06 NOTE — Assessment & Plan Note (Signed)
66 y/o male due for average risk screening colonoscopy. 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 Wants full sedation. Plan for colonoscopy with deep sedation in the OR with Dr. Gala Romney.  I have discussed the risks, alternatives, benefits with regards to but not limited to the risk of reaction to medication, bleeding, infection, perforation and the patient is agreeable to proceed. Written consent to be obtained.

## 2017-04-06 NOTE — Progress Notes (Signed)
Primary Care Physician:  Asencion Noble, MD  Primary Gastroenterologist:  Garfield Cornea, MD   Chief Complaint  Patient presents with  . Colonoscopy    HPI:  Dakota Oneill is a 66 y.o. male here to schedule colonoscopy. His last colonoscopy was in August 2008. He had minimal internal hemorrhoids, few scattered pancolonic diverticula, advised, in 10 years.  Patient doing well. Denies constipation, diarrhea, melena, rectal bleeding. No abdominal pain or GI symptoms. He wants to be fully sedated. 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 Takes Xanax on occasion. Drinks about a 12 pack of beer on the weekend.  Current Outpatient Prescriptions  Medication Sig Dispense Refill  . ALPRAZolam (XANAX) 1 MG tablet Take 1 mg by mouth at bedtime as needed for sleep.    . Ascorbic Acid (VITAMIN C) 100 MG tablet Take 100 mg by mouth daily.    . fexofenadine (ALLEGRA) 180 MG tablet Take 180 mg by mouth daily.    . fish oil-omega-3 fatty acids 1000 MG capsule Take 2 g by mouth daily.    . Multiple Vitamin (MULTIVITAMIN WITH MINERALS) TABS Take 1 tablet by mouth daily.    . vitamin B-12 (CYANOCOBALAMIN) 100 MCG tablet Take 50 mcg by mouth daily.    . vitamin E 100 UNIT capsule Take 100 Units by mouth daily.     No current facility-administered medications for this visit.     Allergies as of 04/06/2017  . (No Known Allergies)    Past Medical History:  Diagnosis Date  . 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 Wears partial dentures    upper and lower    Past Surgical History:  Procedure Laterality Date  . COLONOSCOPY     Dr. Gala Romney: Diverticulosis, internal hemorrhoids, next colonoscopy 10 years  . ESOPHAGEAL DILATION    . PAROTIDECTOMY Right 10/18/2012   Procedure: RIGHT LATERAL PAROTIDECTOMY;  Surgeon: Ascencion Dike, MD;  Location: [place];  Service: ENT;  Laterality: Right;  . SHOULDER SURGERY Right    due to fracture    Family History  Problem Relation Age of Onset  . Colon cancer Neg Hx      Social History   Social History  . Marital status: Divorced    Spouse name: N/A  . Number of children: N/A  . Years of education: N/A   Occupational History  . Not on file.   Social History Main Topics  . Smoking status: Current Every Day Smoker    Packs/day: 1.00    Years: 41.00    Types: Cigarettes  . Smokeless tobacco: Never Used  . Alcohol use Yes     Comment: once per week, 12 beers on the weekend  . Drug use: No  . Sexual activity: Not on file   Other Topics Concern  . Not on file   Social History Narrative  . No narrative on file      ROS:  General: Negative for anorexia, weight loss, fever, chills, fatigue, weakness. Eyes: Negative for vision changes.  ENT: Negative for hoarseness, difficulty swallowing , nasal congestion. CV: Negative for chest pain, angina, palpitations, dyspnea on exertion, peripheral edema.  Respiratory: Negative for dyspnea at rest, dyspnea on exertion, cough, sputum, wheezing.  GI: See history of present illness. GU:  Negative for dysuria, hematuria, urinary incontinence, urinary frequency, nocturnal urination.  MS: Negative for joint pain, low back pain.  Derm: Negative for rash or itching.  Neuro: Negative for weakness, abnormal sensation, seizure, frequent headaches, memory loss, confusion.  Psych: Negative  for anxiety, depression, suicidal ideation, hallucinations.  Endo: Negative for unusual weight change.  Heme: Negative for bruising or bleeding. Allergy: Negative for rash or hives.    Physical Examination:  BP 134/79   Pulse 81   Temp (!) 97.2 F (36.2 C) (Oral)   Ht 6\' 1"  (1.854 m)   Wt 223 lb (101.2 kg)   BMI 29.42 kg/m    General: Well-nourished, well-developed in no acute distress.  Head: Normocephalic, atraumatic.   Eyes: Conjunctiva pink, no icterus. Mouth: Oropharyngeal mucosa moist and pink , no lesions erythema or exudate. Neck: Supple without thyromegaly, masses, or lymphadenopathy.  Lungs: Clear to  auscultation bilaterally.  Heart: Regular rate and rhythm, no murmurs rubs or gallops.  Abdomen: Bowel sounds are normal, nontender, nondistended, no hepatosplenomegaly or masses, no abdominal bruits or    hernia , no rebound or guarding.   Rectal: Not performed Extremities: No lower extremity edema. No clubbing or deformities.  Neuro: Alert and oriented x 4 , grossly normal neurologically.  Skin: Warm and dry, no rash or jaundice.   Psych: Alert and cooperative, normal mood and affect.  Imaging Studies: No results found.

## 2017-04-06 NOTE — Progress Notes (Signed)
CC'D TO PCP °

## 2017-04-20 ENCOUNTER — Telehealth: Payer: Self-pay | Admitting: General Practice

## 2017-04-20 NOTE — Telephone Encounter (Signed)
I called the patient to reschedule his procedure, no answer lmom.

## 2017-04-21 ENCOUNTER — Encounter: Payer: Self-pay | Admitting: General Practice

## 2017-04-21 ENCOUNTER — Telehealth: Payer: Self-pay | Admitting: Internal Medicine

## 2017-04-21 NOTE — Telephone Encounter (Signed)
Noted  

## 2017-04-21 NOTE — Telephone Encounter (Signed)
[phone] patient returned call, please call back, stated that if you do not get an answer he is on the other line and he will call you right back within the minute

## 2017-04-21 NOTE — Telephone Encounter (Signed)
I called the patient back and left a detailed message on his machine in regards to his procedure being rescheduled.  I'm also mailing him out new instructions that reflect this change.

## 2017-04-21 NOTE — Telephone Encounter (Signed)
I tried to call the patient, no answer,lmom 

## 2017-04-27 ENCOUNTER — Other Ambulatory Visit (HOSPITAL_COMMUNITY): Payer: 59

## 2017-04-30 ENCOUNTER — Other Ambulatory Visit (HOSPITAL_COMMUNITY): Payer: 59

## 2017-05-08 NOTE — Patient Instructions (Signed)
Dakota Oneill  05/08/2017     @PREFPERIOPPHARMACY @   Your procedure is scheduled on 05/18/2017.  Report to [name] at 9:30 A.M.  Call this number if you have problems the morning of surgery:  [phone]   Remember:  Do not eat food or drink liquids after midnight.  Take these medicines the morning of surgery with A SIP OF WATER Allegra, Pro-Air inhaler and bring with you   Do not wear jewelry, make-up or nail polish.  Do not wear lotions, powders, or perfumes, or deoderant.  Do not shave 48 hours prior to surgery.  Men may shave face and neck.  Do not bring valuables to the hospital.  [facility] is not responsible for any belongings or valuables.  Contacts, dentures or bridgework may not be worn into surgery.  Leave your suitcase in the car.  After surgery it may be brought to your room.  For patients admitted to the hospital, discharge time will be determined by your treatment team.  Patients discharged the day of surgery will not be allowed to drive home.    Please read over the following fact sheets that you were given. Anesthesia Post-op Instructions     PATIENT INSTRUCTIONS POST-ANESTHESIA  IMMEDIATELY FOLLOWING SURGERY:  Do not drive or operate machinery for the first twenty four hours after surgery.  Do not make any important decisions for twenty four hours after surgery or while taking narcotic pain medications or sedatives.  If you develop intractable nausea and vomiting or a severe headache please notify your doctor immediately.  FOLLOW-UP:  Please make an appointment with your surgeon as instructed. You do not need to follow up with anesthesia unless specifically instructed to do so.  WOUND CARE INSTRUCTIONS (if applicable):  Keep a dry clean dressing on the anesthesia/puncture wound site if there is drainage.  Once the wound has quit draining you may leave it open to air.  Generally you should leave the bandage intact for twenty four hours unless there is  drainage.  If the epidural site drains for more than 36-48 hours please call the anesthesia department.  QUESTIONS?:  Please feel free to call your physician or the hospital operator if you have any questions, and they will be happy to assist you.      Colonoscopy, Adult A colonoscopy is an exam to look at the entire large intestine. During the exam, a lubricated, bendable tube is inserted into the anus and then passed into the rectum, colon, and other parts of the large intestine. A colonoscopy is often done as a part of normal colorectal screening or in response to certain symptoms, such as anemia, persistent diarrhea, abdominal pain, and blood in the stool. The exam can help screen for and diagnose medical problems, including:  Tumors.  Polyps.  Inflammation.  Areas of bleeding.  Tell a health care provider about:  Any allergies you have.  All medicines you are taking, including vitamins, herbs, eye drops, creams, and over-the-counter medicines.  Any problems you or family members have had with anesthetic medicines.  Any blood disorders you have.  Any surgeries you have had.  Any medical conditions you have.  Any problems you have had passing stool. What are the risks? Generally, this is a safe procedure. However, problems may occur, including:  Bleeding.  A tear in the intestine.  A reaction to medicines given during the exam.  Infection (rare).  What happens before the procedure? Eating and drinking restrictions Follow instructions from  your health care provider about eating and drinking, which may include:  A few days before the procedure - follow a low-fiber diet. Avoid nuts, seeds, dried fruit, raw fruits, and vegetables.  1-3 days before the procedure - follow a clear liquid diet. Drink only clear liquids, such as clear broth or bouillon, black coffee or tea, clear juice, clear soft drinks or sports drinks, gelatin dessert, and popsicles. Avoid any liquids  that contain red or purple dye.  On the day of the procedure - do not eat or drink anything during the 2 hours before the procedure, or within the time period that your health care provider recommends.  Bowel prep If you were prescribed an oral bowel prep to clean out your colon:  Take it as told by your health care provider. Starting the day before your procedure, you will need to drink a large amount of medicated liquid. The liquid will cause you to have multiple loose stools until your stool is almost clear or light green.  If your skin or anus gets irritated from diarrhea, you may use these to relieve the irritation: ? Medicated wipes, such as adult wet wipes with aloe and vitamin E. ? A skin soothing-product like petroleum jelly.  If you vomit while drinking the bowel prep, take a break for up to 60 minutes and then begin the bowel prep again. If vomiting continues and you cannot take the bowel prep without vomiting, call your health care provider.  General instructions  Ask your health care provider about changing or stopping your regular medicines. This is especially important if you are taking diabetes medicines or blood thinners.  Plan to have someone take you home from the hospital or clinic. What happens during the procedure?  An IV tube may be inserted into one of your veins.  You will be given medicine to help you relax (sedative).  To reduce your risk of infection: ? Your health care team will wash or sanitize their hands. ? Your anal area will be washed with soap.  You will be asked to lie on your side with your knees bent.  Your health care provider will lubricate a long, thin, flexible tube. The tube will have a camera and a light on the end.  The tube will be inserted into your anus.  The tube will be gently eased through your rectum and colon.  Air will be delivered into your colon to keep it open. You may feel some pressure or cramping.  The camera will be  used to take images during the procedure.  A small tissue sample may be removed from your body to be examined under a microscope (biopsy). If any potential problems are found, the tissue will be sent to a lab for testing.  If small polyps are found, your health care provider may remove them and have them checked for cancer cells.  The tube that was inserted into your anus will be slowly removed. The procedure may vary among health care providers and hospitals. What happens after the procedure?  Your blood pressure, heart rate, breathing rate, and blood oxygen level will be monitored until the medicines you were given have worn off.  Do not drive for 24 hours after the exam.  You may have a small amount of blood in your stool.  You may pass gas and have mild abdominal cramping or bloating due to the air that was used to inflate your colon during the exam.  It is up to  you to get the results of your procedure. Ask your health care provider, or the department performing the procedure, when your results will be ready. This information is not intended to replace advice given to you by your health care provider. Make sure you discuss any questions you have with your health care provider. Document Released: 06/27/2000 Document Revised: 04/30/2016 Document Reviewed: 09/11/2015 Elsevier Interactive Patient Education  2018 [employer].

## 2017-05-11 ENCOUNTER — Encounter (HOSPITAL_COMMUNITY): Payer: Self-pay

## 2017-05-11 ENCOUNTER — Other Ambulatory Visit: Payer: Self-pay

## 2017-05-11 ENCOUNTER — Encounter (HOSPITAL_COMMUNITY)
Admission: RE | Admit: 2017-05-11 | Discharge: 2017-05-11 | Disposition: A | Discharge status: Home / Self Care | Payer: 59 | Source: Ambulatory Visit | Attending: Internal Medicine | Admitting: Internal Medicine

## 2017-05-11 DIAGNOSIS — J449 Chronic obstructive pulmonary disease, unspecified: Secondary | ICD-10-CM | POA: Insufficient documentation

## 2017-05-11 DIAGNOSIS — Z79899 Other long term (current) drug therapy: Secondary | ICD-10-CM | POA: Diagnosis not present

## 2017-05-11 DIAGNOSIS — Z01812 Encounter for preprocedural laboratory examination: Secondary | ICD-10-CM | POA: Insufficient documentation

## 2017-05-11 DIAGNOSIS — F17210 Nicotine dependence, cigarettes, uncomplicated: Secondary | ICD-10-CM | POA: Insufficient documentation

## 2017-05-11 DIAGNOSIS — Z1211 Encounter for screening for malignant neoplasm of colon: Secondary | ICD-10-CM | POA: Diagnosis not present

## 2017-05-11 DIAGNOSIS — Z9283 Personal history of failed moderate sedation: Secondary | ICD-10-CM | POA: Diagnosis not present

## 2017-05-11 DIAGNOSIS — Z972 Presence of dental prosthetic device (complete) (partial): Secondary | ICD-10-CM | POA: Diagnosis not present

## 2017-05-11 DIAGNOSIS — Z01810 Encounter for preprocedural cardiovascular examination: Secondary | ICD-10-CM | POA: Insufficient documentation

## 2017-05-11 DIAGNOSIS — F419 Anxiety disorder, unspecified: Secondary | ICD-10-CM | POA: Diagnosis not present

## 2017-05-11 LAB — BASIC METABOLIC PANEL
Anion gap: 9 (ref 5–15)
BUN: 11 mg/dL (ref 6–20)
CO2: 22 mmol/L (ref 22–32)
CREATININE: 0.81 mg/dL (ref 0.61–1.24)
Calcium: 8.8 mg/dL — ABNORMAL LOW (ref 8.9–10.3)
Chloride: 105 mmol/L (ref 101–111)
GFR calc non Af Amer: >60 mL/min (ref >60)
GLUCOSE: 92 mg/dL (ref 65–99)
Potassium: 4 mmol/L (ref 3.5–5.1)
Sodium: 136 mmol/L (ref 135–145)

## 2017-05-11 LAB — CBC
HEMATOCRIT: 41.9 % (ref 39.0–52.0)
HEMOGLOBIN: 14.3 g/dL (ref 13.0–17.0)
MCH: 32.9 pg (ref 26.0–34.0)
MCHC: 34.1 g/dL (ref 30.0–36.0)
MCV: 96.3 fL (ref 78.0–100.0)
Platelets: 275 10*3/uL (ref 150–400)
RBC: 4.35 MIL/uL (ref 4.22–5.81)
RDW: 12.9 % (ref 11.5–15.5)
WBC: 4.9 10*3/uL (ref 4.0–10.5)

## 2017-05-12 ENCOUNTER — Other Ambulatory Visit (HOSPITAL_COMMUNITY): Payer: 59

## 2017-05-15 ENCOUNTER — Telehealth: Payer: Self-pay | Admitting: *Deleted

## 2017-05-15 NOTE — Telephone Encounter (Signed)
Called [employer] [phone]) and spoke w/ Evelena Peat a PA rep. PA done for TCS service date of 05/18/17. Ref # [id number]. This was approved Case # [id number]. Called pre service center and made aware of approval.

## 2017-05-18 ENCOUNTER — Encounter (HOSPITAL_COMMUNITY): Payer: Self-pay | Admitting: *Deleted

## 2017-05-18 ENCOUNTER — Encounter (HOSPITAL_COMMUNITY)
Admission: RE | Disposition: A | Discharge status: Home / Self Care | Payer: Self-pay | Source: Ambulatory Visit | Attending: Internal Medicine

## 2017-05-18 ENCOUNTER — Ambulatory Visit (HOSPITAL_COMMUNITY)
Admission: RE | Admit: 2017-05-18 | Discharge: 2017-05-18 | Disposition: A | Discharge status: Home / Self Care | Payer: 59 | Source: Ambulatory Visit | Attending: Internal Medicine | Admitting: Internal Medicine

## 2017-05-18 ENCOUNTER — Ambulatory Visit (HOSPITAL_COMMUNITY): Payer: 59 | Admitting: Anesthesiology

## 2017-05-18 DIAGNOSIS — Z1212 Encounter for screening for malignant neoplasm of rectum: Secondary | ICD-10-CM | POA: Diagnosis not present

## 2017-05-18 DIAGNOSIS — K5730 Diverticulosis of large intestine without perforation or abscess without bleeding: Secondary | ICD-10-CM | POA: Diagnosis not present

## 2017-05-18 DIAGNOSIS — J449 Chronic obstructive pulmonary disease, unspecified: Secondary | ICD-10-CM | POA: Diagnosis not present

## 2017-05-18 DIAGNOSIS — F419 Anxiety disorder, unspecified: Secondary | ICD-10-CM | POA: Diagnosis not present

## 2017-05-18 DIAGNOSIS — Z1211 Encounter for screening for malignant neoplasm of colon: Secondary | ICD-10-CM

## 2017-05-18 DIAGNOSIS — K640 First degree hemorrhoids: Secondary | ICD-10-CM | POA: Insufficient documentation

## 2017-05-18 DIAGNOSIS — F17210 Nicotine dependence, cigarettes, uncomplicated: Secondary | ICD-10-CM | POA: Insufficient documentation

## 2017-05-18 DIAGNOSIS — Z79899 Other long term (current) drug therapy: Secondary | ICD-10-CM | POA: Diagnosis not present

## 2017-05-18 DIAGNOSIS — D122 Benign neoplasm of ascending colon: Secondary | ICD-10-CM | POA: Diagnosis not present

## 2017-05-18 HISTORY — PX: COLONOSCOPY WITH PROPOFOL: SHX5780

## 2017-05-18 HISTORY — PX: POLYPECTOMY: SHX5525

## 2017-05-18 SURGERY — COLONOSCOPY WITH PROPOFOL
Anesthesia: Monitor Anesthesia Care

## 2017-05-18 MED ORDER — CHLORHEXIDINE GLUCONATE CLOTH 2 % EX PADS: 6.0000 | MEDICATED_PAD | Freq: Once | CUTANEOUS | Status: DC

## 2017-05-18 MED ORDER — PROPOFOL 10 MG/ML IV BOLUS
INTRAVENOUS | Status: DC | PRN
Start: 2017-05-18 — End: 2017-05-18
  Administered 2017-05-18 (×2): 20 mg via INTRAVENOUS

## 2017-05-18 MED ORDER — HYDROMORPHONE HCL 1 MG/ML IJ SOLN
INTRAMUSCULAR | Status: AC
  Filled 2017-05-18: qty 1

## 2017-05-18 MED ORDER — ONDANSETRON 4 MG PO TBDP
ORAL_TABLET | ORAL | Status: AC
Start: 2017-05-18 — End: 2017-05-18
  Filled 2017-05-18: qty 1

## 2017-05-18 MED ORDER — LIDOCAINE HCL (CARDIAC) 10 MG/ML IV SOLN
INTRAVENOUS | Status: DC | PRN
  Administered 2017-05-18: 50 mg via INTRAVENOUS

## 2017-05-18 MED ORDER — ONDANSETRON 4 MG PO TBDP
4.0000 mg | ORAL_TABLET | Freq: Once | ORAL | Status: AC
  Administered 2017-05-18: 4 mg via ORAL

## 2017-05-18 MED ORDER — LACTATED RINGERS IV SOLN
INTRAVENOUS | Status: DC
  Administered 2017-05-18 (×2): via INTRAVENOUS

## 2017-05-18 MED ORDER — LORAZEPAM 2 MG/ML IJ SOLN
0.5000 mg | Freq: Once | INTRAMUSCULAR | Status: AC
  Administered 2017-05-18: 0.5 mg via INTRAVENOUS

## 2017-05-18 MED ORDER — PROPOFOL 500 MG/50ML IV EMUL
INTRAVENOUS | Status: DC | PRN
  Administered 2017-05-18: 180 ug/kg/min via INTRAVENOUS
  Administered 2017-05-18: 100 ug/kg/min via INTRAVENOUS
  Administered 2017-05-18: 160 ug/kg/min via INTRAVENOUS

## 2017-05-18 MED ORDER — HYDROMORPHONE HCL 1 MG/ML IJ SOLN
0.5000 mg | Freq: Once | INTRAMUSCULAR | Status: AC
  Administered 2017-05-18: 0.5 mg via INTRAVENOUS

## 2017-05-18 MED ORDER — LORAZEPAM 2 MG/ML IJ SOLN
INTRAMUSCULAR | Status: AC
  Filled 2017-05-18: qty 1

## 2017-05-18 NOTE — Anesthesia Procedure Notes (Signed)
Procedure Name: MAC Date/Time: 05/18/2017 11:27 AM Performed by: Vista Deck, CRNA Pre-anesthesia Checklist: Patient identified, Emergency Drugs available, Suction available, Timeout performed and Patient being monitored Patient Re-evaluated:Patient Re-evaluated prior to induction Oxygen Delivery Method: Non-rebreather mask

## 2017-05-18 NOTE — Anesthesia Postprocedure Evaluation (Signed)
Anesthesia Post Note  Patient: Dakota Oneill  Procedure(s) Performed: COLONOSCOPY WITH PROPOFOL (N/A ) POLYPECTOMY  Patient location during evaluation: Short Stay Anesthesia Type: MAC Level of consciousness: awake and alert Pain management: satisfactory to patient Vital Signs Assessment: post-procedure vital signs reviewed and stable Respiratory status: spontaneous breathing Cardiovascular status: stable Postop Assessment: no apparent nausea or vomiting and adequate PO intake     Last Vitals:  Vitals:   05/18/17 1230 05/18/17 1237  BP: (!) 149/90   Pulse: (!) 55 (!) 55  Resp: 14 16  Temp:  36.4 C  SpO2: 98% 99%    Last Pain:  Vitals:   05/18/17 1237  TempSrc: Oral                 Marlenne Ridge

## 2017-05-18 NOTE — Discharge Instructions (Addendum)
Diverticulosis Diverticulosis is a condition that develops when small pouches (diverticula) form in the wall of the large intestine (colon). The colon is where water is absorbed and stool is formed. The pouches form when the inside layer of the colon pushes through weak spots in the outer layers of the colon. You may have a few pouches or many of them. What are the causes? The cause of this condition is not known. What increases the risk? The following factors may make you more likely to develop this condition:  Being older than age 27. Your risk for this condition increases with age. Diverticulosis is rare among people younger than age 36. By age 28, many people have it.  Eating a low-fiber diet.  Having frequent constipation.  Being overweight.  Not getting enough exercise.  Smoking.  Taking over-the-counter pain medicines, like aspirin and ibuprofen.  Having a family history of diverticulosis.  What are the signs or symptoms? In most people, there are no symptoms of this condition. If you do have symptoms, they may include:  Bloating.  Cramps in the abdomen.  Constipation or diarrhea.  Pain in the lower left side of the abdomen.  How is this diagnosed? This condition is most often diagnosed during an exam for other colon problems. Because diverticulosis usually has no symptoms, it often cannot be diagnosed independently. This condition may be diagnosed by:  Using a flexible scope to examine the colon (colonoscopy).  Taking an X-ray of the colon after dye has been put into the colon (barium enema).  Doing a CT scan.  How is this treated? You may not need treatment for this condition if you have never developed an infection related to diverticulosis. If you have had an infection before, treatment may include:  Eating a high-fiber diet. This may include eating more fruits, vegetables, and grains.  Taking a fiber supplement.  Taking a live bacteria supplement  (probiotic).  Taking medicine to relax your colon.  Taking antibiotic medicines.  Follow these instructions at home:  Drink 6-8 glasses of water or more each day to prevent constipation.  Try not to strain when you have a bowel movement.  If you have had an infection before: ? Eat more fiber as directed by your health care provider or your diet and nutrition specialist (dietitian). ? Take a fiber supplement or probiotic, if your health care provider approves.  Take over-the-counter and prescription medicines only as told by your health care provider.  If you were prescribed an antibiotic, take it as told by your health care provider. Do not stop taking the antibiotic even if you start to feel better.  Keep all follow-up visits as told by your health care provider. This is important. Contact a health care provider if:  You have pain in your abdomen.  You have bloating.  You have cramps.  You have not had a bowel movement in 3 days. Get help right away if:  Your pain gets worse.  Your bloating becomes very bad.  You have a fever or chills, and your symptoms suddenly get worse.  You vomit.  You have bowel movements that are bloody or black.  You have bleeding from your rectum. Summary  Diverticulosis is a condition that develops when small pouches (diverticula) form in the wall of the large intestine (colon).  You may have a few pouches or many of them.  This condition is most often diagnosed during an exam for other colon problems.  If you have had an  infection related to diverticulosis, treatment may include increasing the fiber in your diet, taking supplements, or taking medicines. °This information is not intended to replace advice given to you by your health care provider. Make sure you discuss any questions you have with your health care provider. °Document Released: 03/27/2004 Document Revised: 05/19/2016 Document Reviewed: 05/19/2016 °Elsevier Interactive  Patient Education © 2017 [employer] °Colon Polyps °Polyps are tissue growths inside the body. Polyps can grow in many places, including the large intestine (colon). A polyp may be a round bump or a mushroom-shaped growth. You could have one polyp or several. °Most colon polyps are noncancerous (benign). However, some colon polyps can become cancerous over time. °What are the causes? °The exact cause of colon polyps is not known. °What increases the risk? °This condition is more likely to develop in people who: °· Have a family history of colon cancer or colon polyps. °· Are older than 50 or older than 45 if they are African American. °· Have inflammatory bowel disease, such as ulcerative colitis or Crohn disease. °· Are overweight. °· Smoke cigarettes. °· Do not get enough exercise. °· Drink too much alcohol. °· Eat a diet that is: °? High in fat and red meat. °? Low in fiber. °· Had childhood cancer that was treated with abdominal radiation. ° °What are the signs or symptoms? °Most polyps do not cause symptoms. If you have symptoms, they may include: °· Blood coming from your rectum when having a bowel movement. °· Blood in your stool. The stool may look dark red or black. °· A change in bowel habits, such as constipation or diarrhea. ° °How is this diagnosed? °This condition is diagnosed with a colonoscopy. This is a procedure that uses a lighted, flexible scope to look at the inside of your colon. °How is this treated? °Treatment for this condition involves removing any polyps that are found. Those polyps will then be tested for cancer. If cancer is found, your health care provider will talk to you about options for colon cancer treatment. °Follow these instructions at home: °Diet °· Eat plenty of fiber, such as fruits, vegetables, and whole grains. °· Eat foods that are high in calcium and vitamin D, such as milk, cheese, yogurt, eggs, liver, fish, and broccoli. °· Limit foods high in fat, red meats, and  processed meats, such as hot dogs, sausage, bacon, and lunch meats. °· Maintain a healthy weight, or lose weight if recommended by your health care provider. °General instructions °· Do not smoke cigarettes. °· Do not drink alcohol excessively. °· Keep all follow-up visits as told by your health care provider. This is important. This includes keeping regularly scheduled colonoscopies. Talk to your health care provider about when you need a colonoscopy. °· Exercise every day or as told by your health care provider. °Contact a health care provider if: °· You have new or worsening bleeding during a bowel movement. °· You have new or increased blood in your stool. °· You have a change in bowel habits. °· You unexpectedly lose weight. °This information is not intended to replace advice given to you by your health care provider. Make sure you discuss any questions you have with your health care provider. °Document Released: 03/26/2004 Document Revised: 12/06/2015 Document Reviewed: 05/21/2015 °Elsevier Interactive Patient Education © 2018 [employer] ° °Colonoscopy °Discharge Instructions ° °Read the instructions outlined below and refer to this sheet in the next few weeks. These discharge instructions provide you with general information on caring   for yourself after you leave the hospital. Your doctor may also give you specific instructions. While your treatment has been planned according to the most current medical practices available, unavoidable complications occasionally occur. If you have any problems or questions after discharge, call Dr. Rourk at [phone]. °ACTIVITY °· You may resume your regular activity, but move at a slower pace for the next 24 hours.  °· Take frequent rest periods for the next 24 hours.  °· Walking will help get rid of the air and reduce the bloated feeling in your belly (abdomen).  °· No driving for 24 hours (because of the medicine (anesthesia) used during the test).   °· Do not sign any  important legal documents or operate any machinery for 24 hours (because of the anesthesia used during the test).  °NUTRITION °· Drink plenty of fluids.  °· You may resume your normal diet as instructed by your doctor.  °· Begin with a light meal and progress to your normal diet. Heavy or fried foods are harder to digest and may make you feel sick to your stomach (nauseated).  °· Avoid alcoholic beverages for 24 hours or as instructed.  °MEDICATIONS °· You may resume your normal medications unless your doctor tells you otherwise.  °WHAT YOU CAN EXPECT TODAY °· Some feelings of bloating in the abdomen.  °· Passage of more gas than usual.  °· Spotting of blood in your stool or on the toilet paper.  °IF YOU HAD POLYPS REMOVED DURING THE COLONOSCOPY: °· No aspirin products for 7 days or as instructed.  °· No alcohol for 7 days or as instructed.  °· Eat a soft diet for the next 24 hours.  °FINDING OUT THE RESULTS OF YOUR TEST °Not all test results are available during your visit. If your test results are not back during the visit, make an appointment with your caregiver to find out the results. Do not assume everything is normal if you have not heard from your caregiver or the medical facility. It is important for you to follow up on all of your test results.  °SEEK IMMEDIATE MEDICAL ATTENTION IF: °· You have more than a spotting of blood in your stool.  °· Your belly is swollen (abdominal distention).  °· You are nauseated or vomiting.  °· You have a temperature over 101.  °· You have abdominal pain or discomfort that is severe or gets worse throughout the day.  ° °Colon polyp and diverticulosis information provided ° °Further recommendations to follow pending review of pathology report ° °

## 2017-05-18 NOTE — Transfer of Care (Signed)
Immediate Anesthesia Transfer of Care Note  Patient: Alton Bouknight Linzy  Procedure(s) Performed: COLONOSCOPY WITH PROPOFOL (N/A ) POLYPECTOMY  Patient Location: PACU  Anesthesia Type:MAC  Level of Consciousness: awake and alert   Airway & Oxygen Therapy: Patient Spontanous Breathing  Post-op Assessment: Report given to RN and Post -op Vital signs reviewed and stable  Post vital signs: Reviewed and stable  Last Vitals:  Vitals:   05/18/17 0955 05/18/17 1000  BP: (!) 148/87   Resp: 15 (!) 26  Temp:    SpO2:      Last Pain:  Vitals:   05/18/17 0942  TempSrc: Oral      Patients Stated Pain Goal: 7 (15/61/53 7943)  Complications: No apparent anesthesia complications

## 2017-05-18 NOTE — Op Note (Signed)
[facility] Patient Name: Dakota Oneill Procedure Date: 05/18/2017 11:12 AM MRN: [id number] Date of Birth: 1951/06/15 Attending MD: Norvel Richards , MD CSN: [id number] Age: 65 Admit Type: Outpatient Procedure:                Colonoscopy Indications:              Screening for colorectal malignant neoplasm Providers:                Norvel Richards, MD, Lurline Del, RN, Rosina Lowenstein, RN Referring MD:              Medicines:                Propofol per Anesthesia Complications:            No immediate complications. Estimated Blood Loss:     Estimated blood loss was minimal. Procedure:                Pre-Anesthesia Assessment:                           - Prior to the procedure, a History and Physical                            was performed, and patient medications and                            allergies were reviewed. The patient's tolerance of                            previous anesthesia was also reviewed. The risks                            and benefits of the procedure and the sedation                            options and risks were discussed with the patient.                            All questions were answered, and informed consent                            was obtained. Prior Anticoagulants: The patient has                            taken no previous anticoagulant or antiplatelet                            agents. ASA Grade Assessment: II - A patient with                            mild systemic disease. After reviewing the risks  and benefits, the patient was deemed in                            satisfactory condition to undergo the procedure.                           After obtaining informed consent, the colonoscope                            was passed under direct vision. Throughout the                            procedure, the patient's blood pressure, pulse, and                            oxygen  saturations were monitored continuously. The                            EC-[id number] ([id number]) scope was introduced through                            the and advanced to the the cecum, identified by                            appendiceal orifice and ileocecal valve. The                            colonoscopy was performed without difficulty. The                            patient tolerated the procedure well. The quality                            of the bowel preparation was adequate. The                            ileocecal valve, appendiceal orifice, and rectum                            were photographed. The quality of the bowel                            preparation was adequate. Scope In: 11:40:11 AM Scope Out: 12:02:06 PM Scope Withdrawal Time: 0 hours 10 minutes 38 seconds  Total Procedure Duration: 0 hours 21 minutes 55 seconds  Findings:      The perianal and digital rectal examinations were normal.      Two sessile polyps were found in the ascending colon. The polyps were 5       to 6 mm in size.      Scattered small-mouthed diverticula were found in the sigmoid colon and       descending colon.      Non-bleeding internal hemorrhoids were found during retroflexion. The       hemorrhoids were moderate and Grade I (internal hemorrhoids that do not  prolapse). These polyps were removed with a cold snare. Resection and       retrieval were complete. Estimated blood loss was minimal. Impression:               - Two 5 to 6 mm polyps in the ascending colon,                            removed with a cold snare. Resected and retrieved.                           - Diverticulosis in the sigmoid colon and in the                            descending colon.                           - Non-bleeding internal hemorrhoids. Moderate Sedation:      Moderate (conscious) sedation was personally administered by an       anesthesia professional. The following parameters were monitored: oxygen        saturation, heart rate, blood pressure, respiratory rate, EKG, adequacy       of pulmonary ventilation, and response to care. Total physician       intraservice time was 33 minutes. Recommendation:           - Repeat colonoscopy date to be determined after                            pending pathology results are reviewed for                            surveillance based on pathology results.                           - Return to GI office (date not yet determined).                           - Patient has a contact number available for                            emergencies. The signs and symptoms of potential                            delayed complications were discussed with the                            patient. Return to normal activities tomorrow.                            Written discharge instructions were provided to the                            patient.                           - Resume previous diet.                           -  Continue present medications.                           - Repeat colonoscopy date to be determined after                            pending pathology results are reviewed for                            surveillance based on pathology results.                           - Return to GI clinic (date not yet determined). Procedure Code(s):        --- Professional ---                           [phone], Colonoscopy, flexible; with removal of                            tumor(s), polyp(s), or other lesion(s) by snare                            technique Diagnosis Code(s):        --- Professional ---                           Z12.11, Encounter for screening for malignant                            neoplasm of colon                           D12.2, Benign neoplasm of ascending colon                           K64.0, First degree hemorrhoids                           K57.30, Diverticulosis of large intestine without                            perforation or  abscess without bleeding CPT copyright 2016 American Medical Association. All rights reserved. The codes documented in this report are preliminary and upon coder review may  be revised to meet current compliance requirements. [name], MD Norvel Richards, MD 05/18/2017 12:51:54 PM This report has been signed electronically. Number of Addenda: 0

## 2017-05-18 NOTE — H&P (Signed)
@LOGO @   Primary Care Physician:  Asencion Noble, MD Primary Gastroenterologist:  Dr. Gala Romney  Pre-Procedure History & Physical: HPI:  Dakota Oneill is a 66 y.o. male is here for a screening colonoscopy.  No bowel symptoms. No family history of colon cancer. Negative colonoscopy 10 years ago.  Past Medical History:  Diagnosis Date  . 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 Wears partial dentures    upper and lower    Past Surgical History:  Procedure Laterality Date  . COLONOSCOPY     Dr. Gala Romney: Diverticulosis, internal hemorrhoids, next colonoscopy 10 years  . ESOPHAGEAL DILATION    . HEMORROIDECTOMY    . SHOULDER SURGERY Right    due to fracture    Prior to Admission medications   Medication Sig Start Date End Date Taking? Authorizing Provider  ALPRAZolam Duanne Moron) 1 MG tablet Take 1 mg by mouth at bedtime.    Yes [provider]  Apple Cider Vinegar 500 MG TABS Take 500 mg by mouth daily. (0930)   Yes [provider]  Ascorbic Acid (VITAMIN C WITH ROSE HIPS) 1000 MG tablet Take 1,000 mg by mouth daily. (0930)   Yes [provider]  fexofenadine (ALLEGRA) 180 MG tablet Take 180 mg by mouth daily. (0930)   Yes [provider]  Multiple Vitamin (MULTIVITAMIN WITH MINERALS) TABS Take 1 tablet by mouth daily. Centrum Silver for men 50+   Yes [provider]  Omega-3 Fatty Acids (SALMON OIL-1000 PO) Take 1,000 mg by mouth daily. (0930)   Yes [provider]  PROAIR HFA 108 (90 Base) MCG/ACT inhaler Inhale 2 puffs into the lungs every 6 (six) hours as needed. For wheezing/shortness of breath. 02/23/17  Yes [provider]  vitamin B-12 (CYANOCOBALAMIN) 1000 MCG tablet Take 1,000 mcg by mouth daily. (0930)   Yes [provider]  vitamin E 400 UNIT capsule Take 400 Units by mouth daily. (0930)   Yes [provider]  polyethylene glycol-electrolytes (TRILYTE) 420 g solution Take 4,000 mLs by mouth as directed. 04/06/17   Daneil Dolin, MD    Allergies as of 04/06/2017  . (No Known Allergies)    Family History  Problem Relation Age of Onset  . Colon cancer Neg Hx     Social History   Socioeconomic History  . Marital status: Divorced    Spouse name: Not on file  . Number of children: Not on file  . Years of education: Not on file  . Highest education level: Not on file  Social Needs  . Financial resource strain: Not on file  . Food insecurity - worry: Not on file  . Food insecurity - inability: Not on file  . Transportation needs - medical: Not on file  . Transportation needs - non-medical: Not on file  Occupational History  . Not on file  Tobacco Use  . Smoking status: Current Every Day Smoker    Packs/day: 0.50    Years: 41.00    Pack years: 20.50    Types: Cigarettes  . Smokeless tobacco: Never Used  Substance and Sexual Activity  . Alcohol use: Yes    Comment: once per week, 12 beers on the weekend  . Drug use: No  . Sexual activity: Not Currently    Birth control/protection: None  Other Topics Concern  . Not on file  Social History Narrative  . Not on file    Review of Systems: See HPI, otherwise negative ROS  Physical Exam: BP Marland Kitchen)  148/87   Temp 97.7 F (36.5 C) (Oral)   Resp (!) 26   SpO2 96%  General:   Alert,  Well-developed, well-nourished, pleasant and cooperative in NAD Lungs:  Clear throughout to auscultation.   No wheezes, crackles, or rhonchi. No acute distress. Heart:  Regular rate and rhythm; no murmurs, clicks, rubs,  or gallops. Abdomen:  Soft, nontender and nondistended. No masses, hepatosplenomegaly or hernias noted. Normal bowel sounds, without guarding, and without rebound.    Impression/Plan: Dakota Oneill is now here to undergo a screening colonoscopy.  Average risk screening examination. Risks, benefits, limitations, imponderables and alternatives regarding colonoscopy have been reviewed with the patient. Questions have been answered. All parties  agreeable.             Notice:  This dictation was prepared with Dragon dictation along with smaller phrase technology. Any transcriptional errors that result from this process are unintentional and may not be corrected upon review.

## 2017-05-18 NOTE — Anesthesia Preprocedure Evaluation (Addendum)
Anesthesia Evaluation  Patient identified by MRN, date of birth, ID band Patient awake    History of Anesthesia Complications (+) history of anesthetic complications (failed conscious sedation)  Airway Mallampati: I  TM Distance: >3 FB Neck ROM: Full    Dental  (+) Poor Dentition   Pulmonary COPD,  COPD inhaler, Current Smoker,    Pulmonary exam normal breath sounds clear to auscultation       Cardiovascular Exercise Tolerance: Good negative cardio ROS Normal cardiovascular exam Rhythm:Regular Rate:Normal  Normal sinus rhythm Normal ECG  (Oct 2018)   Neuro/Psych Anxiety    GI/Hepatic Neg liver ROS,   Endo/Other  negative endocrine ROS  Renal/GU negative Renal ROSResults for Dakota Oneill, Dakota Oneill (MRN [id number]) as of 05/18/2017 09:37  05/11/2017 10:54 Sodium: 136 Potassium: 4.0 Chloride: 105 CO2: 22 Glucose: 92 BUN: 11 Creatinine: 0.81      Musculoskeletal negative musculoskeletal ROS (+)   Abdominal   Peds  Hematology negative hematology ROS (+)   Anesthesia Other Findings   Reproductive/Obstetrics                             Anesthesia Physical Anesthesia Plan  ASA: II  Anesthesia Plan: MAC   Post-op Pain Management:    Induction:   PONV Risk Score and Plan: 0 and Propofol infusion, Ondansetron and Midazolam  Airway Management Planned:   Additional Equipment:   Intra-op Plan:   Post-operative Plan:   Informed Consent: I have reviewed the patients History and Physical, chart, labs and discussed the procedure including the risks, benefits and alternatives for the proposed anesthesia with the patient or authorized representative who has indicated his/her understanding and acceptance.     Plan Discussed with: CRNA and Surgeon  Anesthesia Plan Comments:        Anesthesia Quick Evaluation

## 2017-05-19 ENCOUNTER — Encounter: Payer: Self-pay | Admitting: Internal Medicine

## 2017-05-20 ENCOUNTER — Encounter (HOSPITAL_COMMUNITY): Payer: Self-pay | Admitting: Internal Medicine

## 2019-11-29 ENCOUNTER — Other Ambulatory Visit: Payer: Self-pay | Admitting: Internal Medicine

## 2019-11-29 ENCOUNTER — Other Ambulatory Visit (HOSPITAL_COMMUNITY): Payer: Self-pay | Admitting: Internal Medicine

## 2019-11-29 DIAGNOSIS — F17200 Nicotine dependence, unspecified, uncomplicated: Secondary | ICD-10-CM

## 2019-11-29 DIAGNOSIS — Z122 Encounter for screening for malignant neoplasm of respiratory organs: Secondary | ICD-10-CM

## 2019-12-19 ENCOUNTER — Ambulatory Visit (HOSPITAL_COMMUNITY): Payer: 59

## 2019-12-19 ENCOUNTER — Encounter (HOSPITAL_COMMUNITY): Payer: Self-pay

## 2021-06-28 ENCOUNTER — Other Ambulatory Visit: Payer: Self-pay

## 2021-06-28 ENCOUNTER — Other Ambulatory Visit (HOSPITAL_COMMUNITY): Payer: Self-pay | Admitting: Internal Medicine

## 2021-06-28 ENCOUNTER — Ambulatory Visit (HOSPITAL_COMMUNITY)
Admission: RE | Admit: 2021-06-28 | Discharge: 2021-06-28 | Disposition: A | Discharge status: Home / Self Care | Payer: 59 | Source: Ambulatory Visit | Attending: Internal Medicine | Admitting: Internal Medicine

## 2021-06-28 DIAGNOSIS — R0781 Pleurodynia: Secondary | ICD-10-CM | POA: Diagnosis present

## 2022-05-14 ENCOUNTER — Encounter: Payer: Self-pay | Admitting: *Deleted

## 2023-04-06 ENCOUNTER — Other Ambulatory Visit (HOSPITAL_COMMUNITY): Payer: Self-pay | Admitting: Internal Medicine

## 2023-04-06 DIAGNOSIS — Z720 Tobacco use: Secondary | ICD-10-CM

## 2023-04-06 DIAGNOSIS — F17219 Nicotine dependence, cigarettes, with unspecified nicotine-induced disorders: Secondary | ICD-10-CM

## 2023-04-27 ENCOUNTER — Ambulatory Visit (HOSPITAL_COMMUNITY)
Admission: RE | Admit: 2023-04-27 | Discharge: 2023-04-27 | Disposition: A | Discharge status: Home / Self Care | Payer: 59 | Source: Ambulatory Visit | Attending: Internal Medicine | Admitting: Internal Medicine

## 2023-04-27 DIAGNOSIS — Z720 Tobacco use: Secondary | ICD-10-CM | POA: Diagnosis present

## 2023-04-27 DIAGNOSIS — F17219 Nicotine dependence, cigarettes, with unspecified nicotine-induced disorders: Secondary | ICD-10-CM | POA: Diagnosis present

## 2023-08-24 DIAGNOSIS — Z79899 Other long term (current) drug therapy: Secondary | ICD-10-CM | POA: Diagnosis not present

## 2023-08-24 DIAGNOSIS — L930 Discoid lupus erythematosus: Secondary | ICD-10-CM | POA: Diagnosis not present

## 2023-08-28 ENCOUNTER — Other Ambulatory Visit (HOSPITAL_COMMUNITY): Payer: Self-pay | Admitting: Internal Medicine

## 2023-08-28 DIAGNOSIS — R911 Solitary pulmonary nodule: Secondary | ICD-10-CM

## 2023-08-31 DIAGNOSIS — Z23 Encounter for immunization: Secondary | ICD-10-CM | POA: Diagnosis not present

## 2023-08-31 DIAGNOSIS — I700 Atherosclerosis of aorta: Secondary | ICD-10-CM | POA: Diagnosis not present

## 2023-08-31 DIAGNOSIS — I2510 Atherosclerotic heart disease of native coronary artery without angina pectoris: Secondary | ICD-10-CM | POA: Diagnosis not present

## 2023-08-31 DIAGNOSIS — R911 Solitary pulmonary nodule: Secondary | ICD-10-CM | POA: Diagnosis not present

## 2023-09-07 ENCOUNTER — Ambulatory Visit (HOSPITAL_COMMUNITY)
Admission: RE | Admit: 2023-09-07 | Discharge: 2023-09-07 | Disposition: A | Discharge status: Home / Self Care | Payer: PPO | Source: Ambulatory Visit | Attending: Internal Medicine | Admitting: Internal Medicine

## 2023-09-07 DIAGNOSIS — F17210 Nicotine dependence, cigarettes, uncomplicated: Secondary | ICD-10-CM | POA: Diagnosis not present

## 2023-09-07 DIAGNOSIS — R911 Solitary pulmonary nodule: Secondary | ICD-10-CM | POA: Diagnosis not present

## 2023-10-19 DIAGNOSIS — E785 Hyperlipidemia, unspecified: Secondary | ICD-10-CM | POA: Diagnosis not present

## 2023-10-19 DIAGNOSIS — Z79899 Other long term (current) drug therapy: Secondary | ICD-10-CM | POA: Diagnosis not present

## 2023-10-26 DIAGNOSIS — E785 Hyperlipidemia, unspecified: Secondary | ICD-10-CM | POA: Diagnosis not present

## 2023-10-26 DIAGNOSIS — M25512 Pain in left shoulder: Secondary | ICD-10-CM | POA: Diagnosis not present

## 2023-11-12 ENCOUNTER — Ambulatory Visit: Admitting: Orthopedic Surgery

## 2023-11-12 ENCOUNTER — Other Ambulatory Visit (INDEPENDENT_AMBULATORY_CARE_PROVIDER_SITE_OTHER): Payer: Self-pay

## 2023-11-12 ENCOUNTER — Encounter: Payer: Self-pay | Admitting: Orthopedic Surgery

## 2023-11-12 VITALS — BP 188/107 | HR 67 | Ht 74.0 in | Wt 202.0 lb

## 2023-11-12 DIAGNOSIS — M25512 Pain in left shoulder: Secondary | ICD-10-CM

## 2023-11-12 DIAGNOSIS — G8929 Other chronic pain: Secondary | ICD-10-CM

## 2023-11-12 DIAGNOSIS — M542 Cervicalgia: Secondary | ICD-10-CM

## 2023-11-12 DIAGNOSIS — M4722 Other spondylosis with radiculopathy, cervical region: Secondary | ICD-10-CM

## 2023-11-12 MED ORDER — GABAPENTIN 100 MG PO CAPS: 100.0000 mg | ORAL_CAPSULE | Freq: Three times a day (TID) | ORAL | 2 refills | Status: DC

## 2023-11-12 MED ORDER — METHYLPREDNISOLONE ACETATE 40 MG/ML IJ SUSP
40.0000 mg | Freq: Once | INTRAMUSCULAR | Status: AC
  Administered 2023-11-12: 40 mg via INTRA_ARTICULAR

## 2023-11-12 NOTE — Progress Notes (Signed)
  Intake history:  BP (!) 188/107   Pulse 67   Ht 6\' 2"  (1.88 m)   Wt 202 lb (91.6 kg)   BMI 25.94 kg/m  Body mass index is 25.94 kg/m.    WHAT ARE WE SEEING YOU FOR TODAY?   left shoulder  How long has this bothered you? (DOI?DOS?WS?)  approximately 3 month(s) ago   Anticoag.  No  Diabetes No  Heart disease no  Hypertension No  SMOKING HX Yes  Kidney disease No  Any ALLERGIES ______________________________________________   Treatment:  Have you taken:  Tylenol  No  Advil No  Had PT No  Had injection No  Other  _________________________

## 2023-11-12 NOTE — Progress Notes (Signed)
  Subjective:     Patient ID: Dakota Oneill, male   DOB: 10/28/1950, 73 y.o.   MRN: [id number]  1 male left shoulder pain x 3 mos but injured his shoulder in 1974. He dis not require surgery at that time.  The patient tried some meloxicam 15 mg as it did not hurt  His pain is actually in the supraspinatus fossa and not around the deltoid or the anterior or posterior joint line  Shoulder Pain  The pain is present in the left shoulder (supraspinatus fossa). Episode onset: 3 mos. There has been a history of trauma. The problem occurs constantly. The problem has been unchanged. The pain is moderate. Associated symptoms include a limited range of motion and tingling. Pertinent negatives include no fever, inability to bear weight, itching, joint locking, joint swelling, numbness or stiffness. He has tried NSAIDS (meloxicam) for the symptoms. The treatment provided no relief. There is no history of rheumatoid arthritis.     Review of Systems  Constitutional:  Negative for fever.  Musculoskeletal:  Negative for stiffness.  Skin:  Negative for itching.  Neurological:  Positive for tingling. Negative for numbness.       Objective:   Physical Exam Musculoskeletal:     Left shoulder: Tenderness present. No swelling, deformity, effusion, laceration, bony tenderness or crepitus. Decreased range of motion. Decreased strength. Normal pulse.       Arms:     Comments: Most of the tenderness in his supraspinatus fossa.  No pain with range of motion of the shoulder forward elevation is about 145 degrees abduction 90 degrees no pain  The weakness is in abduction as well as flexion with 4 out of 5 strength       Assessment:     DG Shoulder Left Result Date: 11/12/2023 Shoulder imaging patient planes of shoulder pain but is really in the supraspinatus fossa The shoulder joint itself looks normal the head looks normal the joint space looks normal and the height looks normal The acromion is a type I My  impression is that the shoulder bony anatomy is normal   DG Cervical Spine 2 or 3 views Result Date: 11/12/2023 C-spine imaging midlevel arthritic changes, abnormal alignment on the lateral view Several areas of disc degeneration with endplate irregularities and anterior osteophytic lipping uncovertebral joint arthrosis Impression cervical spondylosis       Plan:     He could have a neck issue especially with the symptoms of tingling.  His cuff weakness is probably chronic  I will try to give him an injection to see if that relieves the pain he can continue his meloxicam and try some gabapentin .  A course of physical therapy would be helpful as well  If he does not improve hopefully we can localize his symptoms and make the appropriate call in terms of an MRI  I will see him in 6 weeks  Procedure note Procedure injection subacromial space left shoulder Consent was verbal Patient site left shoulder confirmed by verbal means Diagnosis left shoulder pain possible impingement Medication Depo-Medrol  40 mg lidocaine  3 cc Technique the skin was cleaned with alcohol and sprayed with ethyl chloride We did with a 20-gauge needle posterior approach Patient was observed no complications

## 2023-12-15 DIAGNOSIS — Z79899 Other long term (current) drug therapy: Secondary | ICD-10-CM | POA: Diagnosis not present

## 2023-12-15 DIAGNOSIS — L932 Other local lupus erythematosus: Secondary | ICD-10-CM | POA: Diagnosis not present

## 2023-12-24 ENCOUNTER — Ambulatory Visit: Admitting: Orthopedic Surgery

## 2023-12-24 ENCOUNTER — Encounter: Payer: Self-pay | Admitting: Orthopedic Surgery

## 2023-12-24 DIAGNOSIS — M542 Cervicalgia: Secondary | ICD-10-CM

## 2023-12-24 DIAGNOSIS — M4722 Other spondylosis with radiculopathy, cervical region: Secondary | ICD-10-CM | POA: Diagnosis not present

## 2023-12-24 DIAGNOSIS — G8929 Other chronic pain: Secondary | ICD-10-CM | POA: Diagnosis not present

## 2023-12-24 DIAGNOSIS — M25512 Pain in left shoulder: Secondary | ICD-10-CM

## 2023-12-24 NOTE — Progress Notes (Signed)
   There were no vitals taken for this visit.  There is no height or weight on file to calculate BMI.  Chief Complaint  Patient presents with   Shoulder Pain    left    No diagnosis found.  DOI/DOS/ Date: feels better / well after injection states no more pain   Resolved

## 2023-12-24 NOTE — Progress Notes (Signed)
 Follow up   Chief Complaint  Patient presents with   Shoulder Pain    left    Encounter Diagnoses  Name Primary?   Cervical spondylosis with radiculopathy Yes   Chronic left shoulder pain    Neck pain     DOI/DOS/ Date: feels better / well after injection states no more pain   Resolved  73 year old male improving mildly full range of motion pain I assume he may need further treatment

## 2024-02-17 ENCOUNTER — Other Ambulatory Visit: Payer: Self-pay | Admitting: Orthopedic Surgery

## 2024-04-11 DIAGNOSIS — Z79899 Other long term (current) drug therapy: Secondary | ICD-10-CM | POA: Diagnosis not present

## 2024-04-11 DIAGNOSIS — R7301 Impaired fasting glucose: Secondary | ICD-10-CM | POA: Diagnosis not present

## 2024-04-11 DIAGNOSIS — Z125 Encounter for screening for malignant neoplasm of prostate: Secondary | ICD-10-CM | POA: Diagnosis not present

## 2024-04-11 DIAGNOSIS — F419 Anxiety disorder, unspecified: Secondary | ICD-10-CM | POA: Diagnosis not present

## 2024-04-11 DIAGNOSIS — N529 Male erectile dysfunction, unspecified: Secondary | ICD-10-CM | POA: Diagnosis not present

## 2024-04-11 DIAGNOSIS — R030 Elevated blood-pressure reading, without diagnosis of hypertension: Secondary | ICD-10-CM | POA: Diagnosis not present

## 2024-04-13 DIAGNOSIS — Z79899 Other long term (current) drug therapy: Secondary | ICD-10-CM | POA: Diagnosis not present

## 2024-04-13 DIAGNOSIS — L932 Other local lupus erythematosus: Secondary | ICD-10-CM | POA: Diagnosis not present

## 2024-04-13 DIAGNOSIS — D692 Other nonthrombocytopenic purpura: Secondary | ICD-10-CM | POA: Diagnosis not present

## 2024-04-18 DIAGNOSIS — Z23 Encounter for immunization: Secondary | ICD-10-CM | POA: Diagnosis not present

## 2024-04-18 DIAGNOSIS — Z0001 Encounter for general adult medical examination with abnormal findings: Secondary | ICD-10-CM | POA: Diagnosis not present

## 2024-04-18 DIAGNOSIS — F410 Panic disorder [episodic paroxysmal anxiety] without agoraphobia: Secondary | ICD-10-CM | POA: Diagnosis not present

## 2024-04-18 DIAGNOSIS — J438 Other emphysema: Secondary | ICD-10-CM | POA: Diagnosis not present

## 2024-04-18 DIAGNOSIS — I700 Atherosclerosis of aorta: Secondary | ICD-10-CM | POA: Diagnosis not present

## 2024-04-18 DIAGNOSIS — R7301 Impaired fasting glucose: Secondary | ICD-10-CM | POA: Diagnosis not present

## 2024-04-18 DIAGNOSIS — M47812 Spondylosis without myelopathy or radiculopathy, cervical region: Secondary | ICD-10-CM | POA: Diagnosis not present

## 2024-04-18 DIAGNOSIS — J3081 Allergic rhinitis due to animal (cat) (dog) hair and dander: Secondary | ICD-10-CM | POA: Diagnosis not present

## 2024-04-19 ENCOUNTER — Encounter (INDEPENDENT_AMBULATORY_CARE_PROVIDER_SITE_OTHER): Payer: Self-pay | Admitting: *Deleted

## 2024-05-02 ENCOUNTER — Telehealth: Payer: Self-pay

## 2024-05-02 NOTE — Telephone Encounter (Signed)
 Who is your primary care physician: Dr.Roy Fagan  Reasons for the colonoscopy: [employer]  Have you had a colonoscopy before?  HX polyps  Yes 2018 Dr.Rourk  Do you have family history of colon cancer? no  Previous colonoscopy with polyps removed? yes  Do you have a history colorectal cancer?   no  Are you diabetic? If yes, Type 1 or Type 2?    no  Do you have a prosthetic or mechanical heart valve? no  Do you have a pacemaker/defibrillator?   no  Have you had endocarditis/atrial fibrillation? no  Have you had joint replacement within the last 12 months?  no  Do you tend to be constipated or have to use laxatives? no  Do you have any history of drugs or alchohol?  Yes beer 10 a week sometimes  Do you use supplemental oxygen?  no  Have you had a stroke or heart attack within the last 6 months? no  Do you take weight loss medication?  no     Do you take any blood-thinning medications such as: (aspirin, warfarin, Plavix, Aggrenox)  no  If yes we need the name, milligram, dosage and who is prescribing doctor  Current Outpatient Medications on File Prior to Visit  Medication Sig Dispense Refill   ALPRAZolam  (XANAX ) 1 MG tablet Take 1 mg by mouth at bedtime.     Ascorbic Acid (VITAMIN C WITH ROSE HIPS) 1000 MG tablet Take 1,000 mg by mouth daily. (0930)     atorvastatin (LIPITOR) 40 MG tablet SMARTSIG:1 Tablet(s) By Mouth Every Evening     hydroxychloroquine (PLAQUENIL) 200 MG tablet Take 200 mg by mouth 2 (two) times daily.     levocetirizine (XYZAL) 5 MG tablet Take 5 mg by mouth daily.     Multiple Vitamin (MULTIVITAMIN WITH MINERALS) TABS Take 1 tablet by mouth daily. Centrum Silver for men 50+     Omega-3 Fatty Acids (SALMON OIL-1000 PO) Take 1,000 mg by mouth daily. (0930)     triamcinolone cream (KENALOG) 0.1 % Apply 1 Application topically daily.     vitamin B-12 (CYANOCOBALAMIN) 1000 MCG tablet Take 1,000 mcg by mouth daily. (0930)     vitamin E 400 UNIT  capsule Take 400 Units by mouth daily. (0930)     No current facility-administered medications on file prior to visit.    No Known Allergies   Pharmacy: [facility] [place]  Primary Insurance Name: [employer] [id number]  Best number where you can be reached: [phone]

## 2024-05-02 NOTE — Telephone Encounter (Signed)
ASA 2.  ?

## 2024-05-03 MED ORDER — PEG 3350-KCL-NA BICARB-NACL 420 G PO SOLR: 4000.0000 mL | Freq: Once | ORAL | 0 refills | Status: AC

## 2024-05-03 NOTE — Telephone Encounter (Signed)
 Spoke with pt. He has been scheduled for 11/6. Aware will mail instructions but if he does not get next week to call so he can come pick them up. Rx for prep sent to pharmacy.

## 2024-05-03 NOTE — Telephone Encounter (Signed)
 LMOVM to call back

## 2024-05-03 NOTE — Addendum Note (Signed)
 Addended by: JEANELL GRAEME RAMAN on: 05/03/2024 03:31 PM   Modules accepted: Orders

## 2024-05-04 ENCOUNTER — Encounter (INDEPENDENT_AMBULATORY_CARE_PROVIDER_SITE_OTHER): Payer: Self-pay | Admitting: *Deleted

## 2024-05-04 NOTE — Telephone Encounter (Signed)
 Referral completed, [facility] apt letter sent to PCP

## 2024-05-19 ENCOUNTER — Ambulatory Visit (HOSPITAL_COMMUNITY): Admitting: Anesthesiology

## 2024-05-19 ENCOUNTER — Ambulatory Visit (HOSPITAL_COMMUNITY)
Admission: RE | Admit: 2024-05-19 | Discharge: 2024-05-19 | Disposition: A | Discharge status: Home / Self Care | Attending: Internal Medicine | Admitting: Internal Medicine

## 2024-05-19 ENCOUNTER — Encounter (HOSPITAL_COMMUNITY)
Admission: RE | Disposition: A | Discharge status: Home / Self Care | Payer: Self-pay | Source: Home / Self Care | Attending: Internal Medicine

## 2024-05-19 ENCOUNTER — Encounter (HOSPITAL_COMMUNITY): Payer: Self-pay | Admitting: Internal Medicine

## 2024-05-19 ENCOUNTER — Other Ambulatory Visit: Payer: Self-pay

## 2024-05-19 DIAGNOSIS — Z1211 Encounter for screening for malignant neoplasm of colon: Secondary | ICD-10-CM | POA: Diagnosis not present

## 2024-05-19 DIAGNOSIS — J449 Chronic obstructive pulmonary disease, unspecified: Secondary | ICD-10-CM | POA: Diagnosis not present

## 2024-05-19 DIAGNOSIS — D125 Benign neoplasm of sigmoid colon: Secondary | ICD-10-CM | POA: Diagnosis not present

## 2024-05-19 DIAGNOSIS — K635 Polyp of colon: Secondary | ICD-10-CM | POA: Diagnosis not present

## 2024-05-19 DIAGNOSIS — F17200 Nicotine dependence, unspecified, uncomplicated: Secondary | ICD-10-CM | POA: Diagnosis not present

## 2024-05-19 DIAGNOSIS — Z860101 Personal history of adenomatous and serrated colon polyps: Secondary | ICD-10-CM

## 2024-05-19 HISTORY — PX: COLONOSCOPY: SHX5424

## 2024-05-19 HISTORY — DX: Hyperlipidemia, unspecified: E78.5

## 2024-05-19 SURGERY — COLONOSCOPY
Anesthesia: General

## 2024-05-19 MED ORDER — PROPOFOL 10 MG/ML IV BOLUS
INTRAVENOUS | Status: DC | PRN
  Administered 2024-05-19: 125 ug/kg/min via INTRAVENOUS
  Administered 2024-05-19: 80 mg via INTRAVENOUS

## 2024-05-19 MED ORDER — LACTATED RINGERS IV SOLN: INTRAVENOUS | Status: DC

## 2024-05-19 MED ORDER — LIDOCAINE 2% (20 MG/ML) 5 ML SYRINGE
INTRAMUSCULAR | Status: DC | PRN
Start: 2024-05-19 — End: 2024-05-19
  Administered 2024-05-19: 60 mg via INTRAVENOUS

## 2024-05-19 NOTE — Discharge Instructions (Signed)
  Colonoscopy Discharge Instructions  Read the instructions outlined below and refer to this sheet in the next few weeks. These discharge instructions provide you with general information on caring for yourself after you leave the hospital. Your doctor may also give you specific instructions. While your treatment has been planned according to the most current medical practices available, unavoidable complications occasionally occur. If you have any problems or questions after discharge, call Dr. Shaaron at [phone]. ACTIVITY You may resume your regular activity, but move at a slower pace for the next 24 hours.  Take frequent rest periods for the next 24 hours.  Walking will help get rid of the air and reduce the bloated feeling in your belly (abdomen).  No driving for 24 hours (because of the medicine (anesthesia) used during the test).   Do not sign any important legal documents or operate any machinery for 24 hours (because of the anesthesia used during the test).  NUTRITION Drink plenty of fluids.  You may resume your normal diet as instructed by your doctor.  Begin with a light meal and progress to your normal diet. Heavy or fried foods are harder to digest and may make you feel sick to your stomach (nauseated).  Avoid alcoholic beverages for 24 hours or as instructed.  MEDICATIONS You may resume your normal medications unless your doctor tells you otherwise.  WHAT YOU CAN EXPECT TODAY Some feelings of bloating in the abdomen.  Passage of more gas than usual.  Spotting of blood in your stool or on the toilet paper.  IF YOU HAD POLYPS REMOVED DURING THE COLONOSCOPY: No aspirin products for 7 days or as instructed.  No alcohol for 7 days or as instructed.  Eat a soft diet for the next 24 hours.  FINDING OUT THE RESULTS OF YOUR TEST Not all test results are available during your visit. If your test results are not back during the visit, make an appointment with your caregiver to find out the  results. Do not assume everything is normal if you have not heard from your caregiver or the medical facility. It is important for you to follow up on all of your test results.  SEEK IMMEDIATE MEDICAL ATTENTION IF: You have more than a spotting of blood in your stool.  Your belly is swollen (abdominal distention).  You are nauseated or vomiting.  You have a temperature over 101.  You have abdominal pain or discomfort that is severe or gets worse throughout the day.       1 small polyp found and removed     further recommendations to follow pending pathology report

## 2024-05-19 NOTE — Anesthesia Procedure Notes (Signed)
 Date/Time: 05/19/2024 11:25 AM  Performed by: Para Jerelene CROME, CRNAOxygen Delivery Method: Nasal cannula

## 2024-05-19 NOTE — Transfer of Care (Signed)
 Immediate Anesthesia Transfer of Care Note  Patient: Dakota Oneill  Procedure(s) Performed: COLONOSCOPY  Patient Location: Endoscopy Unit  Anesthesia Type:General  Level of Consciousness: drowsy and patient cooperative  Airway & Oxygen Therapy: Patient Spontanous Breathing  Post-op Assessment: Report given to RN and Post -op Vital signs reviewed and stable  Post vital signs: Reviewed and stable  Last Vitals:  Vitals Value Taken Time  BP 130/74 05/19/24 11:50  Temp 36.9 C 05/19/24 11:50  Pulse 57 05/19/24 11:50  Resp 17 05/19/24 11:50  SpO2 98 % 05/19/24 11:50    Last Pain:  Vitals:   05/19/24 1150  TempSrc: Oral  PainSc: 0-No pain         Complications: No notable events documented.

## 2024-05-19 NOTE — Anesthesia Preprocedure Evaluation (Signed)
 Anesthesia Evaluation  Patient identified by MRN, date of birth, ID band Patient awake    History of Anesthesia Complications (+) history of anesthetic complications (failed conscious sedation)  Airway Mallampati: I  TM Distance: >3 FB Neck ROM: Full    Dental  (+) Poor Dentition, Dental Advisory Given, Partial Upper   Pulmonary COPD,  COPD inhaler, Current Smoker   Pulmonary exam normal breath sounds clear to auscultation       Cardiovascular Exercise Tolerance: Good negative cardio ROS Normal cardiovascular exam Rhythm:Regular Rate:Normal  Normal sinus rhythm Normal ECG  (Oct 2018)   Neuro/Psych   Anxiety        GI/Hepatic Neg liver ROS,,,  Endo/Other  negative endocrine ROS    Renal/GU negative Renal ROSResults for Dakota Oneill, Dakota Oneill (MRN [id number]) as of 05/18/2017 09:37  05/11/2017 10:54 Sodium: 136 Potassium: 4.0 Chloride: 105 CO2: 22 Glucose: 92 BUN: 11 Creatinine: 0.81      Musculoskeletal negative musculoskeletal ROS (+)    Abdominal   Peds  Hematology negative hematology ROS (+)   Anesthesia Other Findings Parotid mass  Reproductive/Obstetrics                              Anesthesia Physical Anesthesia Plan  ASA: 2  Anesthesia Plan: General   Post-op Pain Management: Minimal or no pain anticipated   Induction: Intravenous  PONV Risk Score and Plan: Propofol  infusion  Airway Management Planned: Natural Airway and Nasal Cannula  Additional Equipment: None  Intra-op Plan:   Post-operative Plan:   Informed Consent: I have reviewed the patients History and Physical, chart, labs and discussed the procedure including the risks, benefits and alternatives for the proposed anesthesia with the patient or authorized representative who has indicated his/her understanding and acceptance.     Dental advisory given  Plan Discussed with: CRNA  Anesthesia Plan  Comments:          Anesthesia Quick Evaluation

## 2024-05-19 NOTE — Op Note (Signed)
 [facility] Patient Name: Dakota Oneill Procedure Date: 05/19/2024 11:09 AM MRN: [id number] Date of Birth: Sep 10, 1950 Attending MD: Lamar Ozell Hollingshead , MD, [id number] CSN: [id number] Age: 73 Admit Type: Outpatient Procedure:                Colonoscopy Indications:              High risk colon cancer surveillance: Personal                            history of colonic polyps Providers:                Lamar Ozell Hollingshead, MD, Crystal Page, Leandrew Edelman                            RN, RN Referring MD:              Medicines:                Propofol  per Anesthesia Complications:            No immediate complications. Estimated Blood Loss:     Estimated blood loss was minimal. Procedure:                Pre-Anesthesia Assessment:                           - Prior to the procedure, a History and Physical                            was performed, and patient medications and                            allergies were reviewed. The patient's tolerance of                            previous anesthesia was also reviewed. The risks                            and benefits of the procedure and the sedation                            options and risks were discussed with the patient.                            All questions were answered, and informed consent                            was obtained. Prior Anticoagulants: The patient has                            taken no anticoagulant or antiplatelet agents. ASA                            Grade Assessment: II - A patient with mild systemic  disease. After reviewing the risks and benefits,                            the patient was deemed in satisfactory condition to                            undergo the procedure.                           After obtaining informed consent, the colonoscope                            was passed under direct vision. Throughout the                            procedure, the patient's blood  pressure, pulse, and                            oxygen saturations were monitored continuously. The                            [id number] ([id number]) Colon was introduced through                            the anus and advanced to the the cecum, identified                            by appendiceal orifice and ileocecal valve. The                            colonoscopy was performed without difficulty. The                            patient tolerated the procedure well. The quality                            of the bowel preparation was adequate. The                            colonoscopy was performed without difficulty. The                            patient tolerated the procedure well. The quality                            of the bowel preparation was adequate. The                            ileocecal valve, appendiceal orifice, and rectum                            were photographed. Scope In: 11:30:02 AM Scope Out: 11:43:59 AM Scope Withdrawal Time: 0 hours 6 minutes 51 seconds  Total Procedure Duration: 0 hours 13 minutes 57 seconds  Findings:      The perianal and digital rectal examinations were normal.      A 3 mm polyp was found in the sigmoid colon. The polyp was sessile. The       polyp was removed with a cold snare. Resection and retrieval were       complete. Estimated blood loss was minimal.      The exam was otherwise without abnormality on direct and retroflexion       views. Impression:               - One 3 mm polyp in the sigmoid colon, removed with                            a cold snare. Resected and retrieved.                           - The examination was otherwise normal on direct                            and retroflexion views. Moderate Sedation:      Moderate (conscious) sedation was personally administered by an       anesthesia professional. The following parameters were monitored: oxygen       saturation, heart rate, blood pressure, respiratory rate, EKG,  adequacy       of pulmonary ventilation, and response to care. Recommendation:           - Patient has a contact number available for                            emergencies. The signs and symptoms of potential                            delayed complications were discussed with the                            patient. Return to normal activities tomorrow.                            Written discharge instructions were provided to the                            patient.                           - Advance diet as tolerated.                           - Continue present medications.                           - Repeat colonoscopy date to be determined after                            pending pathology results are reviewed for  surveillance.                           - Return to GI office (date not yet determined). Procedure Code(s):        --- Professional ---                           [phone], Colonoscopy, flexible; with removal of                            tumor(s), polyp(s), or other lesion(s) by snare                            technique Diagnosis Code(s):        --- Professional ---                           Z86.010, Personal history of colonic polyps                           D12.5, Benign neoplasm of sigmoid colon CPT copyright 2022 American Medical Association. All rights reserved. The codes documented in this report are preliminary and upon coder review may  be revised to meet current compliance requirements. [name], MD Lamar Ozell Hollingshead, MD 05/19/2024 11:51:58 AM This report has been signed electronically. Number of Addenda: 0

## 2024-05-19 NOTE — H&P (Signed)
 @LOGO @   Gastroenterology Progress Note    Primary Care Physician:  Sheryle Carwin, MD Primary Gastroenterologist:  Dr. Shaaron  Pre-Procedure History & Physical: HPI:  Dakota Oneill is a 73 y.o. male here for  surveillance colonoscopy.  History of multiple colonic adenomas removed 2018.  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 Shaaron: Diverticulosis, internal hemorrhoids, next colonoscopy 10 years   COLONOSCOPY WITH PROPOFOL  N/A 05/18/2017   Procedure: COLONOSCOPY WITH PROPOFOL ;  Surgeon: Shaaron Lamar HERO, MD;  Location: [facility];  Service: Endoscopy;  Laterality: N/A;  1015   ESOPHAGEAL DILATION     HEMORROIDECTOMY     PAROTIDECTOMY Right 10/18/2012   Procedure: RIGHT LATERAL PAROTIDECTOMY;  Surgeon: Ana LELON Moccasin, MD;  Location: [facility];  Service: ENT;  Laterality: Right;   POLYPECTOMY  05/18/2017   Procedure: POLYPECTOMY;  Surgeon: Shaaron Lamar HERO, MD;  Location: [facility];  Service: Endoscopy;;  ascending x2   SHOULDER SURGERY Right    due to fracture    Prior to Admission medications   Medication Sig Start Date End Date Taking? Authorizing Provider  ALPRAZolam  (XANAX ) 1 MG tablet Take 1 mg by mouth at bedtime.   Yes [provider]  Ascorbic Acid (VITAMIN C WITH ROSE HIPS) 1000 MG tablet Take 1,000 mg by mouth daily. (0930)   Yes [provider]  atorvastatin (LIPITOR) 40 MG tablet SMARTSIG:1 Tablet(s) By Mouth Every Evening 12/17/23  Yes [provider]  hydroxychloroquine (PLAQUENIL) 200 MG tablet Take 200 mg by mouth 2 (two) times daily. 10/26/23  Yes [provider]  levocetirizine (XYZAL) 5 MG tablet Take 5 mg by mouth daily. 11/02/23  Yes [provider]  Multiple Vitamin (MULTIVITAMIN WITH MINERALS) TABS Take 1 tablet by mouth daily. Centrum Silver for  men 50+   Yes [provider]  Omega-3 Fatty Acids (SALMON OIL-1000 PO) Take 1,000 mg by mouth daily. (0930)   Yes [provider]  triamcinolone cream (KENALOG) 0.1 % Apply 1 Application topically daily. 12/15/23  Yes [provider]  vitamin B-12 (CYANOCOBALAMIN) 1000 MCG tablet Take 1,000 mcg by mouth daily. (0930)   Yes [provider]  vitamin E 400 UNIT capsule Take 400 Units by mouth daily. (0930)   Yes [provider]    Allergies as of 05/03/2024   (No Known Allergies)    Family History  Problem Relation Age of Onset   Colon cancer Neg Hx     Social History   Socioeconomic History   Marital status: Divorced    Spouse name: Not on file   Number of children: Not on file   Years of education: Not on file   Highest education level: Not on file  Occupational History   Not on file  Tobacco Use   Smoking status: Every Day    Current packs/day: 0.50    Average packs/day: 0.5 packs/day for 41.0 years (20.5 ttl pk-yrs)    Types: Cigarettes   Smokeless tobacco: Never  Vaping Use   Vaping status: Never Used  Substance and Sexual Activity   Alcohol use: Yes    Comment: once per week, 12 beers on the weekend   Drug use: No   Sexual activity: Not Currently    Birth control/protection: None  Other Topics Concern   Not on file  Social History Narrative   Not on file   Social Drivers of Health   Financial Resource Strain: Not on file  Food Insecurity: Not on file  Transportation Needs: Not on file  Physical Activity: Not on file  Stress: Not on file  Social Connections: Unknown (11/25/2021)   Received from [facility]   Social Network    Social Network: Not on file  Intimate Partner Violence: Unknown (10/17/2021)   Received from [facility]   HITS    Physically Hurt: Not on file    Insult or Talk Down To: Not on file    Threaten Physical Harm: Not on file    Scream or Curse: Not on file    Review of Systems   See  HPI, otherwise negative ROS  Physical Exam: BP (!) 187/88   Pulse 65   Temp 97.9 F (36.6 C) (Oral)   Resp (!) 22   Ht 5' 9 (1.753 m)   Wt 86.6 kg   SpO2 97%   BMI 28.21 kg/m  General:   Alert,  Well-developed, well-nourished, pleasant and cooperative in NAD Neck:  Supple; no masses or thyromegaly. No significant cervical adenopathy. Lungs:  Clear throughout to auscultation.   No wheezes, crackles, or rhonchi. No acute distress. Heart:  Regular rate and rhythm; no murmurs, clicks, rubs,  or gallops. Abdomen: Non-distended, normal bowel sounds.  Soft and nontender without appreciable mass or hepatosplenomegaly.   Impression/Plan:     73 year old gentleman here for surveillance colonoscopy.  History of colonic adenomas. The risks, benefits, limitations, alternatives and imponderables have been reviewed with the patient. Questions have been answered. All parties are agreeable.      Notice: This dictation was prepared with Dragon dictation along with smaller phrase technology. Any transcriptional errors that result from this process are unintentional and may not be corrected upon review.

## 2024-05-19 NOTE — Anesthesia Postprocedure Evaluation (Signed)
 Anesthesia Post Note  Patient: Dakota Oneill  Procedure(s) Performed: COLONOSCOPY  Patient location during evaluation: Endoscopy Anesthesia Type: General Level of consciousness: awake and alert Pain management: pain level controlled Vital Signs Assessment: post-procedure vital signs reviewed and stable Respiratory status: spontaneous breathing, nonlabored ventilation and respiratory function stable Cardiovascular status: stable Anesthetic complications: no   There were no known notable events for this encounter.   Last Vitals:  Vitals:   05/19/24 1054 05/19/24 1150  BP: (!) 187/88 130/74  Pulse:  (!) 57  Resp:  17  Temp:  36.9 C  SpO2:  98%    Last Pain:  Vitals:   05/19/24 1150  TempSrc: Oral  PainSc: 0-No pain                 Metha Kolasa L Parag Dorton

## 2024-05-20 ENCOUNTER — Encounter (HOSPITAL_COMMUNITY): Payer: Self-pay | Admitting: Internal Medicine

## 2024-05-20 ENCOUNTER — Ambulatory Visit: Payer: Self-pay | Admitting: Internal Medicine

## 2024-05-20 LAB — SURGICAL PATHOLOGY
